# Patient Record
Sex: Male | Born: 1960 | Race: White | Hispanic: No | Marital: Single | Smoking: Never smoker
Health system: Southern US, Community
[De-identification: ages and names within clinical notes are randomized; demographics above are authoritative.]

## PROBLEM LIST (undated history)

## (undated) DIAGNOSIS — K219 Gastro-esophageal reflux disease without esophagitis: Secondary | ICD-10-CM

## (undated) DIAGNOSIS — K2100 Gastro-esophageal reflux disease with esophagitis, without bleeding: Secondary | ICD-10-CM

## (undated) DIAGNOSIS — L259 Unspecified contact dermatitis, unspecified cause: Secondary | ICD-10-CM

## (undated) DIAGNOSIS — L989 Disorder of the skin and subcutaneous tissue, unspecified: Secondary | ICD-10-CM

## (undated) DIAGNOSIS — G4700 Insomnia, unspecified: Secondary | ICD-10-CM

## (undated) DIAGNOSIS — S92309A Fracture of unspecified metatarsal bone(s), unspecified foot, initial encounter for closed fracture: Secondary | ICD-10-CM

## (undated) DIAGNOSIS — R5383 Other fatigue: Secondary | ICD-10-CM

## (undated) DIAGNOSIS — I5189 Other ill-defined heart diseases: Secondary | ICD-10-CM

## (undated) DIAGNOSIS — M79602 Pain in left arm: Secondary | ICD-10-CM

## (undated) DIAGNOSIS — K5790 Diverticulosis of intestine, part unspecified, without perforation or abscess without bleeding: Secondary | ICD-10-CM

## (undated) DIAGNOSIS — M47812 Spondylosis without myelopathy or radiculopathy, cervical region: Secondary | ICD-10-CM

## (undated) DIAGNOSIS — E559 Vitamin D deficiency, unspecified: Secondary | ICD-10-CM

## (undated) DIAGNOSIS — R1310 Dysphagia, unspecified: Secondary | ICD-10-CM

## (undated) DIAGNOSIS — F439 Reaction to severe stress, unspecified: Secondary | ICD-10-CM

## (undated) DIAGNOSIS — M5410 Radiculopathy, site unspecified: Secondary | ICD-10-CM

## (undated) DIAGNOSIS — E669 Obesity, unspecified: Secondary | ICD-10-CM

## (undated) DIAGNOSIS — R1012 Left upper quadrant pain: Secondary | ICD-10-CM

## (undated) DIAGNOSIS — I10 Essential (primary) hypertension: Secondary | ICD-10-CM

## (undated) DIAGNOSIS — R140 Abdominal distension (gaseous): Secondary | ICD-10-CM

## (undated) DIAGNOSIS — R079 Chest pain, unspecified: Secondary | ICD-10-CM

## (undated) DIAGNOSIS — G4710 Hypersomnia, unspecified: Secondary | ICD-10-CM

## (undated) DIAGNOSIS — R202 Paresthesia of skin: Secondary | ICD-10-CM

## (undated) DIAGNOSIS — E785 Hyperlipidemia, unspecified: Secondary | ICD-10-CM

## (undated) DIAGNOSIS — M5412 Radiculopathy, cervical region: Secondary | ICD-10-CM

## (undated) DIAGNOSIS — J309 Allergic rhinitis, unspecified: Secondary | ICD-10-CM

## (undated) DIAGNOSIS — L309 Dermatitis, unspecified: Secondary | ICD-10-CM

## (undated) HISTORY — DX: Essential (primary) hypertension: I10

## (undated) HISTORY — DX: Radiculopathy, site unspecified: M54.10

## (undated) HISTORY — DX: Dermatitis, unspecified: L30.9

## (undated) HISTORY — DX: Left upper quadrant pain: R10.12

## (undated) HISTORY — DX: Hypersomnia, unspecified: G47.10

## (undated) HISTORY — DX: Reaction to severe stress, unspecified: F43.9

## (undated) HISTORY — DX: Unspecified contact dermatitis, unspecified cause: L25.9

## (undated) HISTORY — DX: Obesity, unspecified: E66.9

## (undated) HISTORY — DX: Disorder of the skin and subcutaneous tissue, unspecified: L98.9

## (undated) HISTORY — DX: Gastro-esophageal reflux disease with esophagitis, without bleeding: K21.00

## (undated) HISTORY — DX: Hyperlipidemia, unspecified: E78.5

## (undated) HISTORY — DX: Insomnia, unspecified: G47.00

## (undated) HISTORY — DX: Allergic rhinitis, unspecified: J30.9

## (undated) HISTORY — DX: Radiculopathy, cervical region: M54.12

## (undated) HISTORY — DX: Vitamin D deficiency, unspecified: E55.9

## (undated) HISTORY — DX: Fracture of unspecified metatarsal bone(s), unspecified foot, initial encounter for closed fracture: S92.309A

## (undated) HISTORY — DX: Other fatigue: R53.83

## (undated) HISTORY — DX: Gastro-esophageal reflux disease with esophagitis: K21.0

## (undated) HISTORY — DX: Dysphagia, unspecified: R13.10

## (undated) HISTORY — DX: Spondylosis without myelopathy or radiculopathy, cervical region: M47.812

## (undated) HISTORY — DX: Diverticulosis of intestine, part unspecified, without perforation or abscess without bleeding: K57.90

## (undated) HISTORY — DX: Gastro-esophageal reflux disease without esophagitis: K21.9

## (undated) HISTORY — DX: Abdominal distension (gaseous): R14.0

## (undated) HISTORY — DX: Other ill-defined heart diseases: I51.89

## (undated) HISTORY — DX: Pain in left arm: M79.602

## (undated) HISTORY — DX: Paresthesia of skin: R20.2

---

## 1998-10-14 ENCOUNTER — Encounter: Payer: Self-pay | Admitting: Emergency Medicine

## 1998-10-14 ENCOUNTER — Emergency Department (HOSPITAL_COMMUNITY): Admission: EM | Admit: 1998-10-14 | Discharge: 1998-10-14 | Payer: Self-pay | Admitting: Emergency Medicine

## 2001-01-26 ENCOUNTER — Encounter: Payer: Self-pay | Admitting: Emergency Medicine

## 2001-01-26 ENCOUNTER — Emergency Department (HOSPITAL_COMMUNITY): Admission: EM | Admit: 2001-01-26 | Discharge: 2001-01-26 | Payer: Self-pay | Admitting: Emergency Medicine

## 2001-12-31 ENCOUNTER — Observation Stay (HOSPITAL_COMMUNITY): Admission: EM | Admit: 2001-12-31 | Discharge: 2002-01-01 | Payer: Self-pay

## 2002-02-03 ENCOUNTER — Encounter: Payer: Self-pay | Admitting: Family Medicine

## 2002-02-03 ENCOUNTER — Encounter: Admission: RE | Admit: 2002-02-03 | Discharge: 2002-02-03 | Payer: Self-pay | Admitting: Family Medicine

## 2002-10-14 ENCOUNTER — Encounter (INDEPENDENT_AMBULATORY_CARE_PROVIDER_SITE_OTHER): Payer: Self-pay

## 2002-10-14 ENCOUNTER — Ambulatory Visit (HOSPITAL_COMMUNITY): Admission: RE | Admit: 2002-10-14 | Discharge: 2002-10-14 | Payer: Self-pay | Admitting: Gastroenterology

## 2003-01-27 ENCOUNTER — Encounter: Payer: Self-pay | Admitting: Neurosurgery

## 2003-01-27 ENCOUNTER — Encounter: Admission: RE | Admit: 2003-01-27 | Discharge: 2003-01-27 | Payer: Self-pay | Admitting: Neurosurgery

## 2003-01-27 ENCOUNTER — Encounter: Payer: Self-pay | Admitting: Radiology

## 2003-02-09 ENCOUNTER — Encounter: Payer: Self-pay | Admitting: Neurosurgery

## 2003-02-09 ENCOUNTER — Encounter: Admission: RE | Admit: 2003-02-09 | Discharge: 2003-02-09 | Payer: Self-pay | Admitting: Neurosurgery

## 2003-02-20 ENCOUNTER — Encounter: Payer: Self-pay | Admitting: Neurosurgery

## 2003-02-20 ENCOUNTER — Encounter: Admission: RE | Admit: 2003-02-20 | Discharge: 2003-02-20 | Payer: Self-pay | Admitting: Neurosurgery

## 2003-04-01 ENCOUNTER — Encounter: Admission: RE | Admit: 2003-04-01 | Discharge: 2003-04-01 | Payer: Self-pay | Admitting: Family Medicine

## 2003-04-01 ENCOUNTER — Encounter: Payer: Self-pay | Admitting: Family Medicine

## 2003-09-10 ENCOUNTER — Encounter: Admission: RE | Admit: 2003-09-10 | Discharge: 2003-09-10 | Payer: Self-pay | Admitting: Neurosurgery

## 2003-09-29 ENCOUNTER — Encounter: Admission: RE | Admit: 2003-09-29 | Discharge: 2003-09-29 | Payer: Self-pay | Admitting: Neurosurgery

## 2003-10-13 ENCOUNTER — Encounter: Admission: RE | Admit: 2003-10-13 | Discharge: 2003-10-13 | Payer: Self-pay | Admitting: Neurosurgery

## 2004-01-19 ENCOUNTER — Encounter: Admission: RE | Admit: 2004-01-19 | Discharge: 2004-01-19 | Payer: Self-pay | Admitting: Otolaryngology

## 2004-02-26 HISTORY — PX: OTHER SURGICAL HISTORY: SHX169

## 2005-10-12 ENCOUNTER — Encounter: Admission: RE | Admit: 2005-10-12 | Discharge: 2005-10-12 | Payer: Self-pay | Admitting: Gastroenterology

## 2007-01-11 ENCOUNTER — Encounter: Admission: RE | Admit: 2007-01-11 | Discharge: 2007-01-11 | Payer: Self-pay | Admitting: Neurosurgery

## 2008-08-30 ENCOUNTER — Emergency Department (HOSPITAL_BASED_OUTPATIENT_CLINIC_OR_DEPARTMENT_OTHER): Admission: EM | Admit: 2008-08-30 | Discharge: 2008-08-31 | Payer: Self-pay | Admitting: Emergency Medicine

## 2008-08-30 ENCOUNTER — Ambulatory Visit: Payer: Self-pay | Admitting: Diagnostic Radiology

## 2009-10-14 ENCOUNTER — Encounter: Admission: RE | Admit: 2009-10-14 | Discharge: 2009-10-14 | Payer: Self-pay | Admitting: Gastroenterology

## 2009-11-02 ENCOUNTER — Encounter: Admission: RE | Admit: 2009-11-02 | Discharge: 2009-11-02 | Payer: Self-pay | Admitting: Gastroenterology

## 2009-11-26 HISTORY — PX: OTHER SURGICAL HISTORY: SHX169

## 2009-12-02 ENCOUNTER — Emergency Department (HOSPITAL_COMMUNITY): Admission: EM | Admit: 2009-12-02 | Discharge: 2009-12-02 | Payer: Self-pay | Admitting: Emergency Medicine

## 2009-12-08 ENCOUNTER — Ambulatory Visit (HOSPITAL_COMMUNITY): Admission: RE | Admit: 2009-12-08 | Discharge: 2009-12-08 | Payer: Self-pay | Admitting: General Surgery

## 2010-03-28 HISTORY — PX: OTHER SURGICAL HISTORY: SHX169

## 2010-09-18 ENCOUNTER — Encounter: Payer: Self-pay | Admitting: Neurosurgery

## 2010-09-28 DIAGNOSIS — R202 Paresthesia of skin: Secondary | ICD-10-CM

## 2010-09-28 HISTORY — DX: Paresthesia of skin: R20.2

## 2010-11-16 LAB — CBC
HCT: 40.6 % (ref 39.0–52.0)
Hemoglobin: 13.7 g/dL (ref 13.0–17.0)
MCHC: 33.9 g/dL (ref 30.0–36.0)
MCV: 87.9 fL (ref 78.0–100.0)
MCV: 88.4 fL (ref 78.0–100.0)
Platelets: 207 10*3/uL (ref 150–400)
RBC: 4.61 MIL/uL (ref 4.22–5.81)
WBC: 7.5 10*3/uL (ref 4.0–10.5)

## 2010-11-16 LAB — DIFFERENTIAL
Basophils Absolute: 0 10*3/uL (ref 0.0–0.1)
Eosinophils Relative: 1 % (ref 0–5)
Lymphocytes Relative: 45 % (ref 12–46)
Lymphs Abs: 3.4 10*3/uL (ref 0.7–4.0)
Monocytes Absolute: 0.6 10*3/uL (ref 0.1–1.0)

## 2010-11-16 LAB — COMPREHENSIVE METABOLIC PANEL
AST: 31 U/L (ref 0–37)
Albumin: 3.7 g/dL (ref 3.5–5.2)
Chloride: 106 mEq/L (ref 96–112)
Creatinine, Ser: 0.79 mg/dL (ref 0.4–1.5)
GFR calc Af Amer: >60 mL/min (ref >60)
Potassium: 3.9 mEq/L (ref 3.5–5.1)
Total Bilirubin: 0.6 mg/dL (ref 0.3–1.2)

## 2010-11-16 LAB — URINALYSIS, ROUTINE W REFLEX MICROSCOPIC
Bilirubin Urine: NEGATIVE
Glucose, UA: NEGATIVE mg/dL
Hgb urine dipstick: NEGATIVE
Specific Gravity, Urine: 1.017 (ref 1.005–1.030)
pH: 7.5 (ref 5.0–8.0)

## 2010-12-27 ENCOUNTER — Other Ambulatory Visit: Payer: Self-pay | Admitting: Internal Medicine

## 2010-12-27 DIAGNOSIS — R109 Unspecified abdominal pain: Secondary | ICD-10-CM

## 2011-01-02 ENCOUNTER — Ambulatory Visit
Admission: RE | Admit: 2011-01-02 | Discharge: 2011-01-02 | Disposition: A | Discharge status: Home / Self Care | Payer: 59 | Source: Ambulatory Visit | Attending: Internal Medicine | Admitting: Internal Medicine

## 2011-01-02 DIAGNOSIS — R109 Unspecified abdominal pain: Secondary | ICD-10-CM

## 2011-01-13 NOTE — Op Note (Signed)
   NAME:  Joseph Clark, Joseph Clark                        ACCOUNT NO.:  [id number]   MEDICAL RECORD NO.:  [id number]                   PATIENT TYPE:  AMB   LOCATION:  ENDO                                 FACILITY:  [facility]   PHYSICIAN:  Petra Kuba, M.D.                 DATE OF BIRTH:  1961/08/13   DATE OF PROCEDURE:  10/14/2002  DATE OF DISCHARGE:                                 OPERATIVE REPORT   PROCEDURE PERFORMED:  Esophagogastroduodenoscopy with biopsy.   ENDOSCOPIST:  Petra Kuba, M.D.   INDICATIONS FOR PROCEDURE:  Longstanding upper tract symptoms.  Want to rule  out Barrett's or other abnormalities.  Consent was signed after the risks,  benefits, methods and options were thoroughly discussed in the office.   MEDICINES USED:  Demerol 50 mg, Versed 5 mg.   DESCRIPTION OF PROCEDURE:  The video endoscope was inserted by direct  vision.  The esophagus was normal.  The distal esophagus was a tiny hiatal  hernia. Scope passed into the stomach where some atrophic gastritis and  antritis were seen and advanced through a normal pylorus into a normal  duodenal bulb and around the C-loop to a normal second portion of the  duodenum.  The scope was withdrawn back to the bulb and a good look there  ruled out ulcers in that location.  Scope was withdrawn back to the stomach  and retroflexed.  The angularis, cardia and fundus were normal except for  the tiny hiatal hernia being confirmed in the cardia.  The lesser and  greater curve were normal on retroflex visualization.  Straight  visualization of the stomach was normal without additional findings except  for the minimal gastritis.  Went ahead and took a few biopsies of the  antrum, few of the proximal stomach to rule out helicobacter, confirm the  gastritis.  Air was suctioned, scope removed.  Again, a good look at the  esophagus was normal without signs of Barrett's or obvious esophagitis.  The  s   ENDOSCOPIC DIAGNOSIS:  1. Tiny hiatal  hernia.  2. Minimal gastritis, antritis, status post biopsy.  3. Otherwise normal esophagogastroduodenoscopy.   PLAN:  Await pathology rule out to Helicobacter pylori.  Continue pump  inhibitors.  Happy to see back sooner p.r.n. or in six months.                                               Petra Kuba, M.D.   MEM/MEDQ  D:  10/14/2002  T:  10/14/2002  Job:  [id number]   cc:   Thelma Barge P. Modesto Charon, M.D.  [address]  [place]  [place] [address]  Fax: [phone]

## 2011-01-13 NOTE — Op Note (Signed)
[facility]  Patient:    Joseph Clark, Joseph Clark Visit Number: [id number] MRN: [id number]          Service Type: MED Location: 2000 2023 01 Attending Physician:  Jetty Duhamel T Dictated by:   Pearletha Furl Alanda Amass, M.D. Proc. Date: 12/31/01 Admit Date:  12/30/2001 Discharge Date: 01/01/2002   CC:         Cardiac Catheterization Lab  Redmond Baseman, M.D.  Lenise Herald, M.D.  Petra Kuba, M.D.   Operative Report  PROCEDURES: 1. Retrograde central aortic catheterization. 2. Selective coronary angiography by Judkins technique. 3. Left ventricular angiogram RAO/LAO projection. 4. Hand injection, abdominal aorta.  DESCRIPTION OF PROCEDURE:  The patient was brought to the second floor CP lab in the postabsorptive state after 5 mg of Valium p.o. premedication.  The right groin was prepped, draped in the usual manner.  Xylocaine, 1%, was used for local anesthesia.  The CRFA was entered with a single anterior puncture using a #18 thin-walled needle and a #6 [place] short Daig sidearm sheath were inserted without difficulty.  Catheterization was performed with #6 French 4-cm tapered preformed coronary and pigtail catheters using Omnipaque dye throughout the procedure.  Pullback pressures to the CA were performed after LV angiogram was done in the RAO and LAO projection, 25 cc/14 cc per second, 20 cc/12 cc per second.  There was no gradient across the aortic valve.  Hand injection above the level of the renal arteries of 5-7 cc showed normal single renal arteries bilaterally, widely patent, and normal-appearing infrarenal abdominal aorta.  The catheter was removed.  Sidearm sheath was flushed.  ACT was measured. Heparin was on hold, coming to the laboratory.  The patient was transferred to the holding area for sheath removal and pressure hemostasis in stable condition.  He tolerated the procedure well.  PRESSURES: 1. LV:  125/0. 2.  LVEDP:  16 mmHg. 3. CA:  125/70 mmHg. 4. There was no gradient across the aortic valve on catheter pullback.  CORONARY ARTERIOGRAM:  Fluoroscopy did not reveal any coronary, intracardiac, or valvular calcification. 1. The main left coronary was normal.  2. The left anterior descending artery was widely patent and smooth throughout    its course.  It coursed to the apex and undersurface of the heart and was    normal.  3. The first diagonal branch trifurcated, was of moderate size.  It arose    after SP-1 and was normal.  4. The small to moderate sized optional diagonal branch (trifurcation vessel)    was normal.  5. The circumflex was nondominant, of moderate size, gave off a small OM-1, a    large trifurcating OM-2, and bifurcated distally.  The circumflex and its    branches were normal and there was a normal PABG branch.  6. The right coronary was a dominant vessel.  The PDA arose near the acute    margin.  The vessel was widely patent and smooth throughout its course and    normal.  LEFT VENTRICULAR ANGIOGRAM:  LV angiogram in the RAO and LAO projection showed a normally contracting ventricle with no segmental wall motion abnormality and no mitral regurgitation.  EF approximately 60%.  BRIEF HISTORY:  This 50 year old divorced [address] is a nonsmoker but has a strong family history of coronary disease.  His mother is a patient of ours and had CAD and interventions in her 25s.  The patient recently was found to have relatively low  HDLs and elevated glucose and was to be evaluated for this.  Over the last week, he has had episodic left precordial chest pain radiating to the left shoulder and left upper extremity. This has occurred both at rest and with exertion.  He has had a prior history of GERD but has been on Nexium as an outpatient.  He has no typical GERD symptoms.  His one CPK was elevated with negative MB and troponin and EKG showed no acute  change after admission by Dr. Anastasio Auerbach for evaluation.  It was felt best that we should proceed with diagnostic catheterization in this setting based on the patients history and his other risk factors such as strong family history of CAD and IDDM and his abnormal laboratory as outlined above.  Fortunately, the patient has normal coronary arteries and left ventricle with no evidence of coronary artery disease.  We recommend reassurance to the patient.  If he has early metabolic syndrome or early diabetes, this should be evaluated.  He is a nonsmoker.  Weight reduction, exercise program may be in order and empiric GI therapy.  Etiology of this chest pain and exertional fatigue is not clear.  CATHETERIZATION DIAGNOSES: 1. Chest pain etiology not determined, probably musculoskeletal.  Rule out    gastrointestinal etiology. 2. Normal coronary arteries and left ventricle. 3. History of mild elevated glucose. 4. Gastroesophageal reflux disease, under therapy. 5. Mild exogenous obesity. 6. Remote left anterior cruciate ligament repair, 1990. 7. Nonsmoker. Dictated by:   Pearletha Furl Alanda Amass, M.D. Attending Physician:  Jetty Duhamel T DD:  12/31/01 TD:  01/01/02 Job: [id number]

## 2011-11-17 ENCOUNTER — Other Ambulatory Visit (HOSPITAL_COMMUNITY): Payer: Self-pay | Admitting: Neurosurgery

## 2011-11-17 DIAGNOSIS — M542 Cervicalgia: Secondary | ICD-10-CM

## 2011-11-20 ENCOUNTER — Inpatient Hospital Stay (HOSPITAL_COMMUNITY): Admission: RE | Admit: 2011-11-20 | Payer: 59 | Source: Ambulatory Visit

## 2011-12-02 ENCOUNTER — Ambulatory Visit (HOSPITAL_COMMUNITY)
Admission: RE | Admit: 2011-12-02 | Discharge: 2011-12-02 | Disposition: A | Discharge status: Home / Self Care | Payer: 59 | Source: Ambulatory Visit | Attending: Neurosurgery | Admitting: Neurosurgery

## 2011-12-02 DIAGNOSIS — M542 Cervicalgia: Secondary | ICD-10-CM | POA: Insufficient documentation

## 2011-12-02 DIAGNOSIS — M47812 Spondylosis without myelopathy or radiculopathy, cervical region: Secondary | ICD-10-CM | POA: Insufficient documentation

## 2011-12-02 DIAGNOSIS — R209 Unspecified disturbances of skin sensation: Secondary | ICD-10-CM | POA: Insufficient documentation

## 2011-12-02 DIAGNOSIS — M5380 Other specified dorsopathies, site unspecified: Secondary | ICD-10-CM | POA: Insufficient documentation

## 2012-10-04 ENCOUNTER — Other Ambulatory Visit: Payer: Self-pay | Admitting: Gastroenterology

## 2013-03-14 ENCOUNTER — Other Ambulatory Visit: Payer: Self-pay | Admitting: Nurse Practitioner

## 2013-03-14 ENCOUNTER — Ambulatory Visit
Admission: RE | Admit: 2013-03-14 | Discharge: 2013-03-14 | Disposition: A | Discharge status: Home / Self Care | Payer: 59 | Source: Ambulatory Visit | Attending: Nurse Practitioner | Admitting: Nurse Practitioner

## 2013-03-14 DIAGNOSIS — R109 Unspecified abdominal pain: Secondary | ICD-10-CM

## 2013-04-24 ENCOUNTER — Other Ambulatory Visit: Payer: Self-pay | Admitting: Internal Medicine

## 2013-07-19 ENCOUNTER — Encounter: Payer: Self-pay | Admitting: Cardiology

## 2013-07-19 ENCOUNTER — Encounter: Payer: Self-pay | Admitting: *Deleted

## 2013-07-19 DIAGNOSIS — J309 Allergic rhinitis, unspecified: Secondary | ICD-10-CM | POA: Insufficient documentation

## 2013-07-19 DIAGNOSIS — K5790 Diverticulosis of intestine, part unspecified, without perforation or abscess without bleeding: Secondary | ICD-10-CM | POA: Insufficient documentation

## 2013-07-19 DIAGNOSIS — L989 Disorder of the skin and subcutaneous tissue, unspecified: Secondary | ICD-10-CM | POA: Insufficient documentation

## 2013-07-19 DIAGNOSIS — E669 Obesity, unspecified: Secondary | ICD-10-CM | POA: Insufficient documentation

## 2013-07-19 DIAGNOSIS — L309 Dermatitis, unspecified: Secondary | ICD-10-CM | POA: Insufficient documentation

## 2013-07-19 DIAGNOSIS — E559 Vitamin D deficiency, unspecified: Secondary | ICD-10-CM | POA: Insufficient documentation

## 2013-07-19 DIAGNOSIS — R5383 Other fatigue: Secondary | ICD-10-CM | POA: Insufficient documentation

## 2013-07-19 DIAGNOSIS — E785 Hyperlipidemia, unspecified: Secondary | ICD-10-CM | POA: Insufficient documentation

## 2013-07-19 DIAGNOSIS — I10 Essential (primary) hypertension: Secondary | ICD-10-CM | POA: Insufficient documentation

## 2013-07-19 DIAGNOSIS — K219 Gastro-esophageal reflux disease without esophagitis: Secondary | ICD-10-CM | POA: Insufficient documentation

## 2013-07-23 ENCOUNTER — Other Ambulatory Visit: Payer: Self-pay | Admitting: *Deleted

## 2013-07-23 ENCOUNTER — Institutional Professional Consult (permissible substitution): Payer: 59 | Admitting: Cardiology

## 2013-08-08 ENCOUNTER — Institutional Professional Consult (permissible substitution): Payer: 59 | Admitting: Cardiology

## 2013-08-18 ENCOUNTER — Encounter: Payer: Self-pay | Admitting: Cardiology

## 2013-08-20 ENCOUNTER — Encounter: Payer: Self-pay | Admitting: Cardiology

## 2013-08-20 ENCOUNTER — Ambulatory Visit (INDEPENDENT_AMBULATORY_CARE_PROVIDER_SITE_OTHER): Payer: 59 | Admitting: Cardiology

## 2013-08-20 ENCOUNTER — Encounter: Payer: Self-pay | Admitting: *Deleted

## 2013-08-20 VITALS — BP 132/90 | HR 65 | Ht 66.0 in | Wt 219.0 lb

## 2013-08-20 DIAGNOSIS — R9431 Abnormal electrocardiogram [ECG] [EKG]: Secondary | ICD-10-CM

## 2013-08-20 DIAGNOSIS — I208 Other forms of angina pectoris: Secondary | ICD-10-CM

## 2013-08-20 DIAGNOSIS — E669 Obesity, unspecified: Secondary | ICD-10-CM

## 2013-08-20 MED ORDER — METOPROLOL SUCCINATE ER 25 MG PO TB24: 25.0000 mg | ORAL_TABLET | Freq: Every day | ORAL | Status: DC

## 2013-08-20 MED ORDER — NITROGLYCERIN 0.4 MG SL SUBL: 0.4000 mg | SUBLINGUAL_TABLET | SUBLINGUAL | Status: AC | PRN
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
## 2013-08-20 NOTE — Patient Instructions (Addendum)
Your physician has requested that you have a LEFT cardiac catheterization WITH RADIAL APPROACH (September 11, 2013 ARRIVE AT 7:00 AM AT [facility]). Cardiac catheterization is used to diagnose and/or treat various heart conditions. Doctors may recommend this procedure for a number of different reasons. The most common reason is to evaluate chest pain. Chest pain can be a symptom of coronary artery disease (CAD), and cardiac catheterization can show whether plaque is narrowing or blocking your heart's arteries. This procedure is also used to evaluate the valves, as well as measure the blood flow and oxygen levels in different parts of your heart. Please follow instruction sheet, as given.  Your physician recommends that you schedule a follow-up appointment IN: 1 WEEK AFTER SCHEDULED CARDIAC CATH   Your physician recommends that you return for lab work in: (CBC, BMET, PT-INR) (September 02, 2013)  Your physician has recommended you make the following change in your medication:   START TOPROL 25 MG ONCE A DAY START NITRO 0.4 MG ONLY AS NEEDED FOR CHEST PAINS  Your physician recommends that you continue on your current medications as directed. Please refer to the Current Medication list given to you today.

## 2013-09-02 ENCOUNTER — Other Ambulatory Visit: Payer: 59

## 2013-09-04 ENCOUNTER — Telehealth: Payer: Self-pay | Admitting: *Deleted

## 2013-09-04 ENCOUNTER — Encounter: Payer: Self-pay | Admitting: *Deleted

## 2013-09-04 NOTE — Telephone Encounter (Signed)
called pt and lm to change the time of his left heart cath and instructions. Pt called back and stated he would like to cancel the procedure and he will call back the week of the 19th to reschedule due to activities in his life. Pt also stated he is not having any heart related problems right now. Called Dr. Anne FuSkains to inform him of the chagnes and he is fine with this and advised that if pt is having any medical problems to seek medical help. Called pt and lm with Dr. Anne FuSkains advise and number was provided if he had any questions. Pt is also aware that he has to do labs before his heart cath and he agrees. [facility].

## 2013-09-04 NOTE — Telephone Encounter (Signed)
Agree 

## 2013-09-04 NOTE — Telephone Encounter (Signed)
lmtcb so I can go over his new instructions for his Lft Heart Cath. Number Provided

## 2013-09-11 ENCOUNTER — Encounter (HOSPITAL_COMMUNITY): Payer: Self-pay

## 2013-09-11 ENCOUNTER — Ambulatory Visit (HOSPITAL_COMMUNITY): Admit: 2013-09-11 | Payer: Self-pay | Admitting: Cardiology

## 2013-09-11 SURGERY — LEFT HEART CATHETERIZATION WITH CORONARY ANGIOGRAM
Anesthesia: LOCAL

## 2013-09-19 ENCOUNTER — Ambulatory Visit: Payer: 59 | Admitting: Cardiology

## 2013-10-06 ENCOUNTER — Encounter (INDEPENDENT_AMBULATORY_CARE_PROVIDER_SITE_OTHER): Payer: Self-pay

## 2013-10-06 ENCOUNTER — Encounter: Payer: Self-pay | Admitting: Cardiology

## 2013-10-06 ENCOUNTER — Ambulatory Visit (INDEPENDENT_AMBULATORY_CARE_PROVIDER_SITE_OTHER): Payer: 59 | Admitting: Cardiology

## 2013-10-06 VITALS — BP 128/86 | HR 73 | Ht 66.0 in | Wt 217.0 lb

## 2013-10-06 DIAGNOSIS — E669 Obesity, unspecified: Secondary | ICD-10-CM

## 2013-10-06 DIAGNOSIS — R079 Chest pain, unspecified: Secondary | ICD-10-CM

## 2013-10-06 DIAGNOSIS — R9431 Abnormal electrocardiogram [ECG] [EKG]: Secondary | ICD-10-CM

## 2013-10-06 NOTE — Patient Instructions (Signed)
Your physician recommends that you continue on your current medications as directed. Please refer to the Current Medication list given to you today.  Your physician has requested that you have en exercise stress myoview. For further information please visit [email or web address]. Please follow instruction sheet, as given.  Your physician wants you to follow-up in: 6 month follow-up with Dr. Anne FuSkains. You will receive a reminder letter in the mail two months in advance. If you don't receive a letter, please call our office to schedule the follow-up appointment.
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
## 2013-10-23 ENCOUNTER — Encounter (HOSPITAL_COMMUNITY): Payer: 59

## 2013-10-27 ENCOUNTER — Encounter (HOSPITAL_COMMUNITY): Payer: 59

## 2013-11-05 ENCOUNTER — Encounter (HOSPITAL_COMMUNITY): Payer: 59

## 2013-11-06 ENCOUNTER — Encounter (HOSPITAL_COMMUNITY): Payer: 59 | Attending: Cardiology | Admitting: Radiology

## 2013-11-06 VITALS — BP 131/84 | HR 63 | Ht 66.0 in | Wt 210.0 lb

## 2013-11-06 DIAGNOSIS — R079 Chest pain, unspecified: Secondary | ICD-10-CM | POA: Insufficient documentation

## 2013-11-06 DIAGNOSIS — R9431 Abnormal electrocardiogram [ECG] [EKG]: Secondary | ICD-10-CM | POA: Insufficient documentation

## 2013-11-06 MED ORDER — TECHNETIUM TC 99M SESTAMIBI GENERIC - CARDIOLITE
33.0000 | Freq: Once | INTRAVENOUS | Status: AC | PRN
  Administered 2013-11-06: 33 via INTRAVENOUS

## 2013-11-06 MED ORDER — TECHNETIUM TC 99M SESTAMIBI GENERIC - CARDIOLITE
11.0000 | Freq: Once | INTRAVENOUS | Status: AC | PRN
  Administered 2013-11-06: 11 via INTRAVENOUS

## 2013-11-06 NOTE — Progress Notes (Signed)
[facility] SITE 3 NUCLEAR MED [address] [place], [place] [address] [phone]    Cardiology Nuclear Med Study  Joseph SnipesVictor S Clark is a 53 y.o. male     MRN : [id number]     DOB: Feb 24, 1961  Procedure Date: 11/06/2013  Nuclear Med Background Indication for Stress Test:  Evaluation for Ischemia History:  No known CAD, Cath 2002 (normal), Echo 2013 EF 66%, MPI (normal) 2013 EF 75% Cardiac Risk Factors: Family History - CAD, Hypertension and Lipids  Symptoms:  Chest Pain and SOB   Nuclear Pre-Procedure Caffeine/Decaff Intake:  None NPO After: 3:00am   Lungs:  clear O2 Sat: 98% on room air. IV 0.9% NS with Angio Cath:  22g  IV Site: R Hand  IV Started by:  Joseph LevanJackie Smith, RN  Chest Size (in):  46 Cup Size: n/a  Height: 5\' 6"  (1.676 m)  Weight:  210 lb (95.255 kg)  BMI:  Body mass index is 33.91 kg/(m^2). Tech Comments:  Toprol held x 24 hours    Nuclear Med Study 1 or 2 day study: 1 day  Stress Test Type:  Stress  Reading MD: N/A  Order Authorizing Provider:  Donato SchultzMark Skains, MD  Resting Radionuclide: Technetium 5357m Sestamibi  Resting Radionuclide Dose: 11.0 mCi   Stress Radionuclide:  Technetium 2457m Sestamibi  Stress Radionuclide Dose: 33.0 mCi           Stress Protocol Rest HR: 63 Stress HR: 144  Rest BP: 131/84 Stress BP: 175/88  Exercise Time (min): 7:30 METS: 9.3           Dose of Adenosine (mg):  n/a Dose of Lexiscan: n/a mg  Dose of Atropine (mg): n/a Dose of Dobutamine: n/a mcg/kg/min (at max HR)  Stress Test Technologist: Joseph Clark, BS-ES  Nuclear Technologist:  Joseph Clark, CNMT     Rest Procedure:  Myocardial perfusion imaging was performed at rest 45 minutes following the intravenous administration of Technetium 1357m Sestamibi. Rest ECG: NSR with non-specific ST-T wave changes and T wave inversions in inferolateral leads.  Stress Procedure:  The patient exercised on the treadmill utilizing the Bruce Protocol for 7:30 minutes. The patient  stopped due to 3/10 chest tighness.  Technetium 3757m Sestamibi was injected at peak exercise and myocardial perfusion imaging was performed after a brief delay.  Stress ECG: No significant change from baseline ECG  QPS Raw Data Images:  Normal; no motion artifact; normal heart/lung ratio. Stress Images:  Normal homogeneous uptake in all areas of the myocardium. Rest Images:  Normal homogeneous uptake in all areas of the myocardium. Subtraction (SDS):  No evidence of ischemia. Transient Ischemic Dilatation (Normal <1.22):  0.89 Lung/Heart Ratio (Normal <0.45):  0.38  Quantitative Gated Spect Images QGS EDV:  99 ml QGS ESV:  32 ml  Impression Exercise Capacity:  Fair exercise capacity. BP Response:  Normal blood pressure response. Clinical Symptoms:  Mild chest pain/dyspnea. ECG Impression:  No significant ST segment change suggestive of ischemia. Comparison with Prior Nuclear Study: No images to compare  Overall Impression:  Normal stress nuclear study.  LV Ejection Fraction: 68%.  LV Wall Motion:  NL LV Function; NL Wall Motion  Limited Joseph Clark

## 2013-11-12 ENCOUNTER — Telehealth: Payer: Self-pay | Admitting: Cardiology

## 2013-11-12 NOTE — Telephone Encounter (Signed)
New message  ° ° °Returning call back to nurse.  °

## 2013-11-19 NOTE — Telephone Encounter (Signed)
Left message on patients voicemail advising of NUC results, see result note.Marland Kitchen.Marland Kitchen..Marland Kitchen

## 2014-09-16 ENCOUNTER — Ambulatory Visit: Payer: Self-pay | Admitting: Dietician

## 2014-09-17 ENCOUNTER — Encounter: Payer: Self-pay | Admitting: Dietician

## 2014-09-17 ENCOUNTER — Encounter: Payer: 59 | Attending: Internal Medicine | Admitting: Dietician

## 2014-09-17 VITALS — Ht 66.5 in | Wt 225.6 lb

## 2014-09-17 DIAGNOSIS — E669 Obesity, unspecified: Secondary | ICD-10-CM

## 2014-09-17 DIAGNOSIS — Z6835 Body mass index (BMI) 35.0-35.9, adult: Secondary | ICD-10-CM | POA: Diagnosis not present

## 2014-09-17 DIAGNOSIS — Z713 Dietary counseling and surveillance: Secondary | ICD-10-CM | POA: Insufficient documentation

## 2014-09-17 NOTE — Progress Notes (Signed)
  Medical Nutrition Therapy:  Appt start time: 1520 end time:  1620.   Assessment:  Primary concerns today: Joseph Clark is here today since after retiring at the end of 2014 and being in very good shape he gained about 30 lbs. Stopped going to the gym and eating out a lot.   He lives by himself (divorced). Before Christmas started cutting out fried foods, sweet, and eating more salads/vegetables. Started working out with his neighbor who is a Systems analystpersonal trainer. Lost about 5 lbs since he made these changes. Weight now is the heaviest he has been.  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    Wants to make sure that he is eating the right things. Has a family hx of diabetes and heart disease. Eats about 1 full meal per day and snacks. Trying to count calories. Eating out about 10-11 meals per week.   Preferred Learning Style:   No preference indicated   Learning Readiness:   Ready  MEDICATIONS: see list   DIETARY INTAKE:  Usual eating pattern includes 2 meals and 2snacks per day.  Avoided foods include: fried foods (recently)  24-hr recall:  B ( before 8AM): glass of almond milk, V8 Fusion juice, black coffee, sometimes with applesauce Snk ( AM): none L ( PM): Asian Chicken Salad at [employer] or whatever he is close to when he's out, fish filet or chicken nuggets  Snk ( PM):none D ( PM): grilled salmon, salad with vinegar and sweet potato fries Snk ( PM): cucumbers, carrots, applesauce Beverages: water, almond milk, V8 Fusion, coffee, beer sometimes (2 max if out)  Usual physical activity: 3-4 x day 50 minutes circuit training with friend who is a Psychologist, educationaltrainer  Estimated energy needs: 2000 calories 225 g carbohydrates 150 g protein 56 g fat  Progress Towards Goal(s):  In progress.   Nutritional Diagnosis:  Adwolf-3.3 Overweight/obesity As related to large portions sizes and frequent restaurant meals.  As evidenced by BMI of 35.9.     Intervention:  Nutrition counseling provided.  Plan: Continue exercising around 4 x week. Add a protein to your carbohydrates at breakfast - protein shake with fruit, egg sandwich, or yogurt with fruit.  Have snacks available if you are hungry (protein and carbs) - peanut butter crackers, [facility] Protein bar, nuts with fruit, jerky, cheese sticks, yogurt. Portion out snacks ahead of time. Aim to fill half of your plate with vegetables at lunch dinner. Limit carbs and protein to a quarter of your plate (size of the palm of you hand). Choose lean protein (continue avoiding fried foods). Think about have frozen vegetables or pre washed and chopped vegetables to add to meals at home (rotisserie chicken). Try using small plates or boxing up half of your meal at restaurants to help with portions. Continue drinking mostly water.  Choose 100% whole wheat bread.  Teaching Method Utilized:  Visual Auditory Hands on  Handouts given during visit include:  MyPlate Handout  15 g CHO Snacks  Yellow Card  Barriers to learning/adherence to lifestyle change: none  Demonstrated degree of understanding via:  Teach Back   Monitoring/Evaluation:  Dietary intake, exercise, and body weight in 6 week(s).

## 2014-09-17 NOTE — Patient Instructions (Signed)
Continue exercising around 4 x week. Add a protein to your carbohydrates at breakfast - protein shake with fruit, egg sandwich, or yogurt with fruit.  Have snacks available if you are hungry (protein and carbs) - peanut butter crackers, [facility] Protein bar, nuts with fruit, jerky, cheese sticks, yogurt. Portion out snacks ahead of time. Aim to fill half of your plate with vegetables at lunch dinner. Limit carbs and protein to a quarter of your plate (size of the palm of you hand). Choose lean protein (continue avoiding fried foods). Think about have frozen vegetables or pre washed and chopped vegetables to add to meals at home (rotisserie chicken). Try using small plates or boxing up half of your meal at restaurants to help with portions. Continue drinking mostly water.  Choose 100% whole wheat bread.

## 2014-09-25 ENCOUNTER — Other Ambulatory Visit: Payer: Self-pay | Admitting: Gastroenterology

## 2014-09-25 DIAGNOSIS — R1084 Generalized abdominal pain: Secondary | ICD-10-CM

## 2014-10-01 ENCOUNTER — Ambulatory Visit
Admission: RE | Admit: 2014-10-01 | Discharge: 2014-10-01 | Disposition: A | Discharge status: Home / Self Care | Payer: 59 | Source: Ambulatory Visit | Attending: Gastroenterology | Admitting: Gastroenterology

## 2014-10-01 DIAGNOSIS — R1084 Generalized abdominal pain: Secondary | ICD-10-CM

## 2014-10-01 MED ORDER — IOHEXOL 300 MG/ML  SOLN
125.0000 mL | Freq: Once | INTRAMUSCULAR | Status: AC | PRN
  Administered 2014-10-01: 125 mL via INTRAVENOUS

## 2014-10-29 ENCOUNTER — Ambulatory Visit: Payer: 59 | Admitting: Dietician

## 2014-11-25 ENCOUNTER — Ambulatory Visit: Payer: 59 | Admitting: Dietician

## 2015-03-11 ENCOUNTER — Other Ambulatory Visit: Payer: Self-pay

## 2015-03-11 ENCOUNTER — Telehealth: Payer: Self-pay

## 2015-03-11 ENCOUNTER — Emergency Department (HOSPITAL_COMMUNITY): Payer: 59

## 2015-03-11 ENCOUNTER — Telehealth: Payer: Self-pay | Admitting: Cardiology

## 2015-03-11 ENCOUNTER — Encounter (HOSPITAL_COMMUNITY): Payer: Self-pay | Admitting: Neurology

## 2015-03-11 ENCOUNTER — Other Ambulatory Visit (HOSPITAL_COMMUNITY): Payer: Self-pay

## 2015-03-11 ENCOUNTER — Observation Stay (HOSPITAL_COMMUNITY)
Admission: EM | Admit: 2015-03-11 | Discharge: 2015-03-12 | Disposition: A | Discharge status: Home / Self Care | Payer: 59 | Attending: Cardiology | Admitting: Cardiology

## 2015-03-11 DIAGNOSIS — M4722 Other spondylosis with radiculopathy, cervical region: Secondary | ICD-10-CM | POA: Insufficient documentation

## 2015-03-11 DIAGNOSIS — R0789 Other chest pain: Principal | ICD-10-CM | POA: Insufficient documentation

## 2015-03-11 DIAGNOSIS — R079 Chest pain, unspecified: Secondary | ICD-10-CM | POA: Diagnosis present

## 2015-03-11 DIAGNOSIS — E669 Obesity, unspecified: Secondary | ICD-10-CM | POA: Diagnosis not present

## 2015-03-11 DIAGNOSIS — J309 Allergic rhinitis, unspecified: Secondary | ICD-10-CM | POA: Diagnosis not present

## 2015-03-11 DIAGNOSIS — Z8249 Family history of ischemic heart disease and other diseases of the circulatory system: Secondary | ICD-10-CM | POA: Insufficient documentation

## 2015-03-11 DIAGNOSIS — Z7982 Long term (current) use of aspirin: Secondary | ICD-10-CM | POA: Diagnosis not present

## 2015-03-11 DIAGNOSIS — Z6834 Body mass index (BMI) 34.0-34.9, adult: Secondary | ICD-10-CM | POA: Insufficient documentation

## 2015-03-11 DIAGNOSIS — Z79899 Other long term (current) drug therapy: Secondary | ICD-10-CM | POA: Insufficient documentation

## 2015-03-11 DIAGNOSIS — K219 Gastro-esophageal reflux disease without esophagitis: Secondary | ICD-10-CM | POA: Diagnosis present

## 2015-03-11 DIAGNOSIS — I10 Essential (primary) hypertension: Secondary | ICD-10-CM | POA: Insufficient documentation

## 2015-03-11 DIAGNOSIS — E785 Hyperlipidemia, unspecified: Secondary | ICD-10-CM | POA: Insufficient documentation

## 2015-03-11 DIAGNOSIS — K210 Gastro-esophageal reflux disease with esophagitis: Secondary | ICD-10-CM | POA: Insufficient documentation

## 2015-03-11 DIAGNOSIS — E559 Vitamin D deficiency, unspecified: Secondary | ICD-10-CM | POA: Diagnosis not present

## 2015-03-11 HISTORY — DX: Chest pain, unspecified: R07.9

## 2015-03-11 LAB — BASIC METABOLIC PANEL
Anion gap: 8 (ref 5–15)
BUN: 13 mg/dL (ref 6–20)
CHLORIDE: 106 mmol/L (ref 101–111)
CO2: 26 mmol/L (ref 22–32)
Calcium: 9.9 mg/dL (ref 8.9–10.3)
Creatinine, Ser: 0.94 mg/dL (ref 0.61–1.24)
Glucose, Bld: 121 mg/dL — ABNORMAL HIGH (ref 65–99)
Potassium: 4.4 mmol/L (ref 3.5–5.1)
Sodium: 140 mmol/L (ref 135–145)

## 2015-03-11 LAB — I-STAT TROPONIN, ED: TROPONIN I, POC: 0 ng/mL (ref 0.00–0.08)

## 2015-03-11 LAB — CBC
HCT: 42.9 % (ref 39.0–52.0)
Hemoglobin: 14.8 g/dL (ref 13.0–17.0)
MCH: 29.1 pg (ref 26.0–34.0)
MCHC: 34.5 g/dL (ref 30.0–36.0)
MCV: 84.4 fL (ref 78.0–100.0)
Platelets: 248 10*3/uL (ref 150–400)
RBC: 5.08 MIL/uL (ref 4.22–5.81)
RDW: 12.7 % (ref 11.5–15.5)
WBC: 8.1 10*3/uL (ref 4.0–10.5)

## 2015-03-11 MED ORDER — NITROGLYCERIN 0.4 MG SL SUBL: 0.4000 mg | SUBLINGUAL_TABLET | SUBLINGUAL | Status: DC | PRN

## 2015-03-11 MED ORDER — SODIUM CHLORIDE 0.9 % IV SOLN: 250.0000 mL | INTRAVENOUS | Status: DC | PRN

## 2015-03-11 MED ORDER — ASPIRIN EC 81 MG PO TBEC
81.0000 mg | DELAYED_RELEASE_TABLET | Freq: Every day | ORAL | Status: DC
  Administered 2015-03-12: 81 mg via ORAL
  Filled 2015-03-11: qty 1

## 2015-03-11 MED ORDER — ONDANSETRON HCL 4 MG/2ML IJ SOLN: 4.0000 mg | Freq: Four times a day (QID) | INTRAMUSCULAR | Status: DC | PRN

## 2015-03-11 MED ORDER — HEPARIN SODIUM (PORCINE) 5000 UNIT/ML IJ SOLN
5000.0000 [IU] | Freq: Three times a day (TID) | INTRAMUSCULAR | Status: DC
  Administered 2015-03-11 – 2015-03-12 (×2): 5000 [IU] via SUBCUTANEOUS
  Filled 2015-03-11 (×2): qty 1

## 2015-03-11 MED ORDER — ASPIRIN 81 MG PO CHEW: 81.0000 mg | CHEWABLE_TABLET | ORAL | Status: DC

## 2015-03-11 MED ORDER — SODIUM CHLORIDE 0.9 % WEIGHT BASED INFUSION: 1.0000 mL/kg/h | INTRAVENOUS | Status: DC

## 2015-03-11 MED ORDER — SODIUM CHLORIDE 0.9 % IJ SOLN: 3.0000 mL | INTRAMUSCULAR | Status: DC | PRN

## 2015-03-11 MED ORDER — ASPIRIN 81 MG PO CHEW
324.0000 mg | CHEWABLE_TABLET | Freq: Once | ORAL | Status: AC
  Administered 2015-03-11: 324 mg via ORAL
  Filled 2015-03-11: qty 4

## 2015-03-11 MED ORDER — NITROGLYCERIN 0.4 MG SL SUBL
0.4000 mg | SUBLINGUAL_TABLET | SUBLINGUAL | Status: DC | PRN
  Administered 2015-03-11 (×2): 0.4 mg via SUBLINGUAL
  Filled 2015-03-11: qty 1

## 2015-03-11 MED ORDER — ACETAMINOPHEN 325 MG PO TABS: 650.0000 mg | ORAL_TABLET | ORAL | Status: DC | PRN

## 2015-03-11 MED ORDER — SODIUM CHLORIDE 0.9 % WEIGHT BASED INFUSION
3.0000 mL/kg/h | INTRAVENOUS | Status: DC
  Administered 2015-03-12: 3 mL/kg/h via INTRAVENOUS

## 2015-03-11 MED ORDER — SODIUM CHLORIDE 0.9 % IJ SOLN: 3.0000 mL | Freq: Two times a day (BID) | INTRAMUSCULAR | Status: DC

## 2015-03-11 MED ORDER — ASPIRIN 81 MG PO CHEW
81.0000 mg | CHEWABLE_TABLET | Freq: Every day | ORAL | Status: DC
  Administered 2015-03-12: 81 mg via ORAL
  Filled 2015-03-11 (×2): qty 1

## 2015-03-11 MED ORDER — NITROGLYCERIN 0.4 MG SL SUBL
0.4000 mg | SUBLINGUAL_TABLET | SUBLINGUAL | Status: DC | PRN
Start: 2015-03-11 — End: 2015-03-11

## 2015-03-11 MED ORDER — METOPROLOL SUCCINATE ER 25 MG PO TB24
25.0000 mg | ORAL_TABLET | Freq: Every day | ORAL | Status: DC
  Administered 2015-03-11 – 2015-03-12 (×2): 25 mg via ORAL
  Filled 2015-03-11 (×2): qty 1

## 2015-03-11 NOTE — ED Notes (Signed)
Pt reports cp since Monday and has gotten worse since then. Took aspirin and it eased off. Now reports central cp, 4/10. Denies sob, has felt nauseated. Pt is a x 4. Denies cardiac hx.
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
## 2015-03-11 NOTE — Telephone Encounter (Signed)
New Message   Pt c/o of Chest Pain: STAT if CP now or developed within 24 hours  1. Are you having CP right now?  YES  2. Are you experiencing any other symptoms (ex. SOB, nausea, vomiting, sweating)?  NO  3. How long have you been experiencing CP?  03/10/15  4. Is your CP continuous or coming and going?  Coming and going  5. Have you taken Nitroglycerin?  No, he has taken 3 Asprin  ?
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
## 2015-03-11 NOTE — Telephone Encounter (Signed)
Patient states he feels a little better and wants to know if he still needs to go to the ED.  Patient st he is still having continued chest discomfort, but not nearly as severe as the last couple days. Encouraged patient to go to ED to get evaluated for continued chest discomfort. Patient agrees with treatment plan.

## 2015-03-11 NOTE — Telephone Encounter (Signed)
Agree. SKAINS, MARK, MD  

## 2015-03-11 NOTE — ED Provider Notes (Signed)
I saw and evaluated the patient, reviewed the resident's note and I agree with the findings and plan.   EKG Interpretation   Date/Time:  Thursday March 11 2015 15:37:51 EDT Ventricular Rate:  72 PR Interval:  164 QRS Duration: 86 QT Interval:  398 QTC Calculation: 435 R Axis:   53 Text Interpretation:  Normal sinus rhythm Cannot rule out Anterior infarct  , age undetermined T wave abnormality, consider lateral ischemia Abnormal  ECG No significant change since last tracing Confirmed by Trace Cederberg  MD,  Deric Bocock ([id number]) on 03/11/2015 5:05:04 PM     Patient here complaining of increased chest pain characterized as substernal. Patient's EKG is unchanged from prior. Consult cardiology  Lorre NickAnthony Jais Demir, MD 03/11/15 1705

## 2015-03-12 ENCOUNTER — Encounter (HOSPITAL_COMMUNITY)
Admission: EM | Disposition: A | Discharge status: Home / Self Care | Payer: 59 | Source: Home / Self Care | Attending: Emergency Medicine

## 2015-03-12 ENCOUNTER — Encounter (HOSPITAL_COMMUNITY): Payer: Self-pay | Admitting: Cardiology

## 2015-03-12 DIAGNOSIS — R079 Chest pain, unspecified: Secondary | ICD-10-CM | POA: Diagnosis not present

## 2015-03-12 HISTORY — PX: CARDIAC CATHETERIZATION: SHX172

## 2015-03-12 LAB — BASIC METABOLIC PANEL
ANION GAP: 8 (ref 5–15)
BUN: 16 mg/dL (ref 6–20)
CO2: 28 mmol/L (ref 22–32)
Calcium: 9.3 mg/dL (ref 8.9–10.3)
Chloride: 103 mmol/L (ref 101–111)
Creatinine, Ser: 1.08 mg/dL (ref 0.61–1.24)
GFR calc Af Amer: >60 mL/min (ref >60)
Glucose, Bld: 156 mg/dL — ABNORMAL HIGH (ref 65–99)
POTASSIUM: 4.1 mmol/L (ref 3.5–5.1)
SODIUM: 139 mmol/L (ref 135–145)

## 2015-03-12 LAB — CBC
HCT: 42.6 % (ref 39.0–52.0)
HEMOGLOBIN: 14.5 g/dL (ref 13.0–17.0)
MCH: 29.2 pg (ref 26.0–34.0)
MCHC: 34 g/dL (ref 30.0–36.0)
MCV: 85.7 fL (ref 78.0–100.0)
Platelets: 237 10*3/uL (ref 150–400)
RBC: 4.97 MIL/uL (ref 4.22–5.81)
RDW: 12.9 % (ref 11.5–15.5)
WBC: 8.8 10*3/uL (ref 4.0–10.5)

## 2015-03-12 LAB — LIPID PANEL
CHOLESTEROL: 209 mg/dL — AB (ref 0–200)
HDL: 33 mg/dL — AB (ref >40)
LDL CALC: 115 mg/dL — AB (ref 0–99)
TRIGLYCERIDES: 305 mg/dL — AB (ref <150)
Total CHOL/HDL Ratio: 6.3 RATIO
VLDL: 61 mg/dL — ABNORMAL HIGH (ref 0–40)

## 2015-03-12 LAB — PROTIME-INR
INR: 1.03 (ref 0.00–1.49)
Prothrombin Time: 13.7 seconds (ref 11.6–15.2)

## 2015-03-12 LAB — TROPONIN I

## 2015-03-12 SURGERY — LEFT HEART CATH AND CORONARY ANGIOGRAPHY
Anesthesia: LOCAL

## 2015-03-12 MED ORDER — FENTANYL CITRATE (PF) 100 MCG/2ML IJ SOLN
INTRAMUSCULAR | Status: DC | PRN
  Administered 2015-03-12: 25 ug via INTRAVENOUS

## 2015-03-12 MED ORDER — SODIUM CHLORIDE 0.9 % IJ SOLN: 3.0000 mL | INTRAMUSCULAR | Status: DC | PRN

## 2015-03-12 MED ORDER — LIDOCAINE HCL (PF) 1 % IJ SOLN
INTRAMUSCULAR | Status: DC | PRN
  Administered 2015-03-12: 10:00:00

## 2015-03-12 MED ORDER — MIDAZOLAM HCL 2 MG/2ML IJ SOLN
INTRAMUSCULAR | Status: DC | PRN
  Administered 2015-03-12: 2 mg via INTRAVENOUS

## 2015-03-12 MED ORDER — IOHEXOL 350 MG/ML SOLN
INTRAVENOUS | Status: DC | PRN
  Administered 2015-03-12: 90 mL via INTRACARDIAC

## 2015-03-12 MED ORDER — HEPARIN SODIUM (PORCINE) 1000 UNIT/ML IJ SOLN
INTRAMUSCULAR | Status: DC | PRN
  Administered 2015-03-12: 5000 [IU] via INTRAVENOUS

## 2015-03-12 MED ORDER — MIDAZOLAM HCL 2 MG/2ML IJ SOLN
INTRAMUSCULAR | Status: AC
  Filled 2015-03-12: qty 2

## 2015-03-12 MED ORDER — HEPARIN (PORCINE) IN NACL 2-0.9 UNIT/ML-% IJ SOLN
INTRAMUSCULAR | Status: AC
  Filled 2015-03-12: qty 1500

## 2015-03-12 MED ORDER — NITROGLYCERIN 1 MG/10 ML FOR IR/CATH LAB
INTRA_ARTERIAL | Status: AC
  Filled 2015-03-12: qty 10

## 2015-03-12 MED ORDER — SODIUM CHLORIDE 0.9 % IV SOLN: 250.0000 mL | INTRAVENOUS | Status: DC | PRN

## 2015-03-12 MED ORDER — LIDOCAINE HCL (PF) 1 % IJ SOLN
INTRAMUSCULAR | Status: AC
  Filled 2015-03-12: qty 30

## 2015-03-12 MED ORDER — SODIUM CHLORIDE 0.9 % WEIGHT BASED INFUSION: 1.0000 mL/kg/h | INTRAVENOUS | Status: AC

## 2015-03-12 MED ORDER — FENTANYL CITRATE (PF) 100 MCG/2ML IJ SOLN
INTRAMUSCULAR | Status: AC
  Filled 2015-03-12: qty 2

## 2015-03-12 MED ORDER — VERAPAMIL HCL 2.5 MG/ML IV SOLN
INTRAVENOUS | Status: DC | PRN
  Administered 2015-03-12: 10:00:00 via INTRA_ARTERIAL

## 2015-03-12 MED ORDER — SODIUM CHLORIDE 0.9 % IJ SOLN: 3.0000 mL | Freq: Two times a day (BID) | INTRAMUSCULAR | Status: DC

## 2015-03-12 SURGICAL SUPPLY — 14 items

## 2015-03-12 NOTE — Interval H&P Note (Signed)
History and Physical Interval Note:  03/12/2015 9:49 AM  Joseph Clark  has presented today for surgery, with the diagnosis of cp  The various methods of treatment have been discussed with the patient and family. After consideration of risks, benefits and other options for treatment, the patient has consented to  Procedure(s): Left Heart Cath and Coronary Angiography (N/A) as a surgical intervention .  The patient's history has been reviewed, patient examined, no change in status, stable for surgery.  I have reviewed the patient's chart and labs.  Questions were answered to the patient's satisfaction.   Cath Lab Visit (complete for each Cath Lab visit)  Clinical Evaluation Leading to the Procedure:   ACS: Yes.    Non-ACS:    Anginal Classification: CCS III  Anti-ischemic medical therapy: Minimal Therapy (1 class of medications)  Non-Invasive Test Results: No non-invasive testing performed  Prior CABG: No previous CABG        Theron Aristaeter [facility] LLCJordanMD,FACC 03/12/2015 9:49 AM

## 2015-03-12 NOTE — Progress Notes (Signed)
Patient Name: Joseph Clark Date of Encounter: 03/12/2015  Primary Cardiologist: Dr. Anne Fu   Principal Problem:   Chest pain with moderate risk for cardiac etiology Active Problems:   Obesity   Hyperlipidemia   GERD (gastroesophageal reflux disease)   HTN (hypertension)    SUBJECTIVE  Denies any CP overnight. Denies ever having SOB at rest. States has been having CP at home and feels winded after climbing stairs.  CURRENT MEDS . aspirin  81 mg Oral Daily  . aspirin EC  81 mg Oral Daily  . heparin  5,000 Units Subcutaneous 3 times per day  . metoprolol succinate  25 mg Oral Daily  . sodium chloride  3 mL Intravenous Q12H    OBJECTIVE  Filed Vitals:   03/11/15 1800 03/11/15 1815 03/11/15 1903 03/12/15 0500  BP: 109/78 129/87 134/85 128/80  Pulse: 73 77 71 67  Temp:   97.8 F (36.6 C) 97.8 F (36.6 C)  TempSrc:   Oral Oral  Resp: Height:   5' 6.5" (1.689 m)   Weight:   219 lb 9.6 oz (99.61 kg)   SpO2: 94% 98% 99% 97%    Intake/Output Summary (Last 24 hours) at 03/12/15 0830 Last data filed at 03/12/15 0700  Gross per 24 hour  Intake 328.68 ml  Output    650 ml  Net -321.32 ml   Filed Weights   03/11/15 1903  Weight: 219 lb 9.6 oz (99.61 kg)    PHYSICAL EXAM  General: Pleasant, NAD. Neuro: Alert and oriented X 3. Moves all extremities spontaneously. Psych: Normal affect. HEENT:  Normal  Neck: Supple without bruits or JVD. Lungs:  Resp regular and unlabored, CTA. Heart: RRR no s3, s4, or murmurs. Abdomen: Soft, non-tender, non-distended, BS + x 4.  Extremities: No clubbing, cyanosis or edema. DP/PT/Radials 2+ and equal bilaterally.  Accessory Clinical Findings  CBC  Recent Labs  03/11/15 1545 03/12/15 0425  WBC 8.1 8.8  HGB 14.8 14.5  HCT 42.9 42.6  MCV 84.4 85.7  PLT 248 237   Basic Metabolic Panel  Recent Labs  03/11/15 1545 03/12/15 0425  NA 140 139  K 4.4 4.1  CL 106 103  CO2 26 28  GLUCOSE 121* 156*  BUN 13  16  CREATININE 0.94 1.08  CALCIUM 9.9 9.3   Cardiac Enzymes  Recent Labs  03/12/15 0425  TROPONINI <0.03   Fasting Lipid Panel  Recent Labs  03/12/15 0425  CHOL 209*  HDL 33*  LDLCALC 115*  TRIG 305*  CHOLHDL 6.3    TELE NSR without significant ventricular ectopy    ECG  NSR with TWI in lateral leads   Radiology/Studies  Dg Chest 2 View  03/11/2015   CLINICAL DATA:  Chest pain starting Monday  EXAM: CHEST  2 VIEW  COMPARISON:  08/30/2008  FINDINGS: The heart size and mediastinal contours are within normal limits. Both lungs are clear. The visualized skeletal structures are unremarkable. Accessory azygos fissure/lobe again noted.  IMPRESSION: No active cardiopulmonary disease.   Electronically Signed   By: Joseph Clark M.D.   On: 03/11/2015 16:05    ASSESSMENT AND PLAN  1. Chest pain  - trop negative.   - no known CAD Normal myoview in 2009 Now with 3 days chest pressure /sob/ doe   - planning for LHC today, risk and benefit of the procedure explained include bleeding, vascular/renal injury, arrhythmia, MI, stroke, loss of life or limb, patient display clear understanding  and agree to proceed.     2. HLD: chol 209, trig 305, HDL 33, LDL 115   Findings of cath will direct Rx    3. GERD  Signed, Joseph Clark, Hao PA-C Pager: [phone] Patient seen and examined  I have amended note to reflect my findings Plan for cath  Today.  Joseph Clark

## 2015-03-12 NOTE — H&P (View-Only) (Signed)
 Patient Name: Joseph Clark Date of Encounter: 03/12/2015  Primary Cardiologist: Dr. Skains   Principal Problem:   Chest pain with moderate risk for cardiac etiology Active Problems:   Obesity   Hyperlipidemia   GERD (gastroesophageal reflux disease)   HTN (hypertension)    SUBJECTIVE  Denies any CP overnight. Denies ever having SOB at rest. States has been having CP at home and feels winded after climbing stairs.  CURRENT MEDS . aspirin  81 mg Oral Daily  . aspirin EC  81 mg Oral Daily  . heparin  5,000 Units Subcutaneous 3 times per day  . metoprolol succinate  25 mg Oral Daily  . sodium chloride  3 mL Intravenous Q12H    OBJECTIVE  Filed Vitals:   03/11/15 1800 03/11/15 1815 03/11/15 1903 03/12/15 0500  BP: 109/78 129/87 134/85 128/80  Pulse: 73 77 71 67  Temp:   97.8 F (36.6 C) 97.8 F (36.6 C)  TempSrc:   Oral Oral  Resp: 13 14 16 18  Height:   5' 6.5" (1.689 m)   Weight:   219 lb 9.6 oz (99.61 kg)   SpO2: 94% 98% 99% 97%    Intake/Output Summary (Last 24 hours) at 03/12/15 0830 Last data filed at 03/12/15 0700  Gross per 24 hour  Intake 328.68 ml  Output    650 ml  Net -321.32 ml   Filed Weights   03/11/15 1903  Weight: 219 lb 9.6 oz (99.61 kg)    PHYSICAL EXAM  General: Pleasant, NAD. Neuro: Alert and oriented X 3. Moves all extremities spontaneously. Psych: Normal affect. HEENT:  Normal  Neck: Supple without bruits or JVD. Lungs:  Resp regular and unlabored, CTA. Heart: RRR no s3, s4, or murmurs. Abdomen: Soft, non-tender, non-distended, BS + x 4.  Extremities: No clubbing, cyanosis or edema. DP/PT/Radials 2+ and equal bilaterally.  Accessory Clinical Findings  CBC  Recent Labs  03/11/15 1545 03/12/15 0425  WBC 8.1 8.8  HGB 14.8 14.5  HCT 42.9 42.6  MCV 84.4 85.7  PLT 248 237   Basic Metabolic Panel  Recent Labs  03/11/15 1545 03/12/15 0425  NA 140 139  K 4.4 4.1  CL 106 103  CO2 26 28  GLUCOSE 121* 156*  BUN 13  16  CREATININE 0.94 1.08  CALCIUM 9.9 9.3   Cardiac Enzymes  Recent Labs  03/12/15 0425  TROPONINI <0.03   Fasting Lipid Panel  Recent Labs  03/12/15 0425  CHOL 209*  HDL 33*  LDLCALC 115*  TRIG 305*  CHOLHDL 6.3    TELE NSR without significant ventricular ectopy    ECG  NSR with TWI in lateral leads   Radiology/Studies  Dg Chest 2 View  03/11/2015   CLINICAL DATA:  Chest pain starting Monday  EXAM: CHEST  2 VIEW  COMPARISON:  08/30/2008  FINDINGS: The heart size and mediastinal contours are within normal limits. Both lungs are clear. The visualized skeletal structures are unremarkable. Accessory azygos fissure/lobe again noted.  IMPRESSION: No active cardiopulmonary disease.   Electronically Signed   By: Liviu  Pop M.D.   On: 03/11/2015 16:05    ASSESSMENT AND PLAN  1. Chest pain  - trop negative.   - no known CAD Normal myoview in 2009 Now with 3 days chest pressure /sob/ doe   - planning for LHC today, risk and benefit of the procedure explained include bleeding, vascular/renal injury, arrhythmia, MI, stroke, loss of life or limb, patient display clear understanding   and agree to proceed.     2. HLD: chol 209, trig 305, HDL 33, LDL 115   Findings of cath will direct Rx    3. GERD  Signed, Amedeo PlentyMeng, Hao PA-C Pager: [phone] Patient seen and examined  I have amended note to reflect my findings Plan for cath  Today.  Dietrich PatesPaula Ross

## 2015-03-12 NOTE — Discharge Instructions (Signed)
No driving for 24 hours. No lifting over 5 lbs for 1 week. No sexual activity for 1 week. Keep procedure site clean & dry. If you notice increased pain, swelling, bleeding or pus, call/return!  You may shower, but no soaking baths/hot tubs/pools for 1 week.  ° ° °

## 2015-03-12 NOTE — Progress Notes (Signed)
Discharge instructions and teaching reviewed with patient. VSS. Pt discharging home with nephew

## 2015-03-12 NOTE — Discharge Summary (Signed)
Discharge Summary   Patient ID: Joseph SnipesVictor S Clark,  MRN: [id number], DOB/AGE: September 03, 1960 54 y.o.  Admit date: 03/11/2015 Discharge date: 03/12/2015  Primary Care Provider: No primary care provider on file. Primary Cardiologist: Dr. Anne FuSkains  Discharge Diagnoses Principal Problem:   Chest pain with moderate risk for cardiac etiology Active Problems:   Obesity   Hyperlipidemia   GERD (gastroesophageal reflux disease)   HTN (hypertension)   Allergies No Known Allergies  Procedures  Cardiac catheterization 03/12/2015  The left ventricular systolic function is normal.  Normal coronary anatomy  Recommendation: stable for DC today. Consider other causes of chest pain as an outpatient.     Hospital Course  Patient is a [age] year old male who has been followed by Dr. Anne FuSkains in the past for chest pain and abnormal EKG. He had a nuclear stress test in March 2015 with which was negative for ischemia. He has past medical history of hyperlipidemia and a family history of coronary artery disease. [facility] with left-sided chest pain radiating to the left shoulder and arm. It has been intermittent for the past 3 days with symptom occurring at rest. He also has been complaining of dyspnea on exertion recently. EKG showed normal sinus rhythm with lateral T-wave inversion unchanged from the previous EKG. Given his symptom and family history, after discussing with the patient various options, it was decided to undergo left heart catheterization to definitively define coronary anatomy.  He was admitted to cardiology service, serial troponin overnight were negative. He underwent cardiac catheterization on 03/12/2015 which showed normal coronary artery without sign of CAD, EF 55-65% by visual estimate. His lipid panel on 7/15 showed cholesterol 209, triglycerides 205, LDL 115, HDL 33. After discussing with M.D., we have encouraged the patient to try weight loss  and exercise and diet. If his lipid panel continued to be high in 3 month, he potentially will need statin therapy. Otherwise he is deemed stable for discharge from cardiology perspective, I have arranged outpatient follow-up with Dr. Anne FuSkains.   Discharge Vitals Blood pressure 132/84, pulse 73, temperature 97.8 F (36.6 C), temperature source Oral, resp. rate 20, height 5' 6.5" (1.689 m), weight 219 lb 9.6 oz (99.61 kg), SpO2 100 %.  Filed Weights   03/11/15 1903  Weight: 219 lb 9.6 oz (99.61 kg)    Labs  CBC  Recent Labs  03/11/15 1545 03/12/15 0425  WBC 8.1 8.8  HGB 14.8 14.5  HCT 42.9 42.6  MCV 84.4 85.7  PLT 248 237   Basic Metabolic Panel  Recent Labs  03/11/15 1545 03/12/15 0425  NA 140 139  K 4.4 4.1  CL 106 103  CO2 26 28  GLUCOSE 121* 156*  BUN 13 16  CREATININE 0.94 1.08  CALCIUM 9.9 9.3   Cardiac Enzymes  Recent Labs  03/12/15 0425  TROPONINI <0.03   Fasting Lipid Panel  Recent Labs  03/12/15 0425  CHOL 209*  HDL 33*  LDLCALC 115*  TRIG 305*  CHOLHDL 6.3    Disposition  Pt is being discharged home today in good condition.  Follow-up Plans & Appointments      Follow-up Information    Follow up with Donato SchultzSKAINS, MARK, MD On 05/14/2015.   Specialty:  Cardiology   Why:  2:15pm.   Contact information:   [address] [place] [address] [phone]       Discharge Medications    Medication List    TAKE these medications  aspirin 81 MG tablet  Take 81 mg by mouth at bedtime.     CENTRUM SILVER PO  Take 1 tablet by mouth daily.     cholecalciferol 1000 UNITS tablet  Commonly known as:  VITAMIN D  Take 1,000 Units by mouth daily.     fexofenadine 180 MG tablet  Commonly known as:  ALLEGRA  Take 180 mg by mouth at bedtime.     Fish Oil 1000 MG Caps  Take 1 capsule by mouth 3 (three) times daily with meals.     Glucosamine HCl 1500 MG Tabs  Take 1 tablet by mouth daily.     Glucosamine-MSM  500-500 MG Tabs  Take 1 tablet by mouth daily.     nitroGLYCERIN 0.4 MG SL tablet  Commonly known as:  NITROSTAT  Place 1 tablet (0.4 mg total) under the tongue every 5 (five) minutes as needed for chest pain.        Duration of Discharge Encounter   Greater than 30 minutes including physician time.  Ramond Dial PA-C Pager: [phone] 03/12/2015, 2:17 PM   Agree with d/c plans as noted by Harrell Lark  F/u of lipids as outpt.    Dietrich Pates

## 2015-03-15 MED FILL — Heparin Sodium (Porcine) 2 Unit/ML in Sodium Chloride 0.9%: INTRAMUSCULAR | Qty: 500 | Status: AC

## 2015-05-14 ENCOUNTER — Encounter: Payer: Self-pay | Admitting: Cardiology

## 2015-05-14 ENCOUNTER — Ambulatory Visit (INDEPENDENT_AMBULATORY_CARE_PROVIDER_SITE_OTHER): Payer: 59 | Admitting: Cardiology

## 2015-05-14 VITALS — BP 140/88 | HR 68 | Ht 67.0 in | Wt 219.5 lb

## 2015-05-14 DIAGNOSIS — E669 Obesity, unspecified: Secondary | ICD-10-CM

## 2015-05-14 DIAGNOSIS — R0789 Other chest pain: Secondary | ICD-10-CM | POA: Diagnosis not present

## 2015-05-14 DIAGNOSIS — R9431 Abnormal electrocardiogram [ECG] [EKG]: Secondary | ICD-10-CM

## 2015-05-14 NOTE — Patient Instructions (Signed)
Medication Instructions:  Your physician recommends that you continue on your current medications as directed. Please refer to the Current Medication list given to you today.  Labwork: NONE  Testing/Procedures: NONE  Follow-Up: Your physician recommends that you schedule a follow-up appointment as needed.    
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
## 2018-01-09 ENCOUNTER — Ambulatory Visit
Admission: RE | Admit: 2018-01-09 | Discharge: 2018-01-09 | Disposition: A | Discharge status: Home / Self Care | Payer: 59 | Source: Ambulatory Visit | Attending: Internal Medicine | Admitting: Internal Medicine

## 2018-01-09 ENCOUNTER — Other Ambulatory Visit: Payer: Self-pay | Admitting: Internal Medicine

## 2018-01-09 DIAGNOSIS — R0789 Other chest pain: Secondary | ICD-10-CM

## 2018-07-11 ENCOUNTER — Other Ambulatory Visit: Payer: Self-pay | Admitting: Neurosurgery

## 2018-07-11 DIAGNOSIS — M5414 Radiculopathy, thoracic region: Secondary | ICD-10-CM

## 2018-07-11 DIAGNOSIS — M5412 Radiculopathy, cervical region: Secondary | ICD-10-CM

## 2018-07-28 ENCOUNTER — Ambulatory Visit
Admission: RE | Admit: 2018-07-28 | Discharge: 2018-07-28 | Disposition: A | Discharge status: Home / Self Care | Payer: 59 | Source: Ambulatory Visit | Attending: Neurosurgery | Admitting: Neurosurgery

## 2018-07-28 DIAGNOSIS — M5412 Radiculopathy, cervical region: Secondary | ICD-10-CM

## 2018-07-28 DIAGNOSIS — M5414 Radiculopathy, thoracic region: Secondary | ICD-10-CM

## 2018-11-08 ENCOUNTER — Other Ambulatory Visit: Payer: Self-pay | Admitting: Internal Medicine

## 2018-11-08 DIAGNOSIS — E1169 Type 2 diabetes mellitus with other specified complication: Secondary | ICD-10-CM

## 2018-11-15 ENCOUNTER — Other Ambulatory Visit: Payer: 59

## 2018-11-21 ENCOUNTER — Other Ambulatory Visit: Payer: 59

## 2019-02-06 ENCOUNTER — Ambulatory Visit
Admission: RE | Admit: 2019-02-06 | Discharge: 2019-02-06 | Disposition: A | Discharge status: Home / Self Care | Payer: 59 | Source: Ambulatory Visit | Attending: Internal Medicine | Admitting: Internal Medicine

## 2019-02-06 DIAGNOSIS — E1169 Type 2 diabetes mellitus with other specified complication: Secondary | ICD-10-CM

## 2019-06-30 ENCOUNTER — Other Ambulatory Visit: Payer: Self-pay | Admitting: Internal Medicine

## 2019-06-30 ENCOUNTER — Ambulatory Visit
Admission: RE | Admit: 2019-06-30 | Discharge: 2019-06-30 | Disposition: A | Discharge status: Home / Self Care | Payer: 59 | Source: Ambulatory Visit | Attending: Internal Medicine | Admitting: Internal Medicine

## 2019-06-30 DIAGNOSIS — M25512 Pain in left shoulder: Secondary | ICD-10-CM

## 2019-10-29 ENCOUNTER — Other Ambulatory Visit: Payer: Self-pay | Admitting: Internal Medicine

## 2019-10-29 ENCOUNTER — Ambulatory Visit
Admission: RE | Admit: 2019-10-29 | Discharge: 2019-10-29 | Disposition: A | Discharge status: Home / Self Care | Payer: 59 | Source: Ambulatory Visit | Attending: Internal Medicine | Admitting: Internal Medicine

## 2019-10-29 DIAGNOSIS — M549 Dorsalgia, unspecified: Secondary | ICD-10-CM

## 2020-02-10 ENCOUNTER — Encounter (INDEPENDENT_AMBULATORY_CARE_PROVIDER_SITE_OTHER): Payer: Self-pay | Admitting: Otolaryngology

## 2020-02-10 ENCOUNTER — Other Ambulatory Visit: Payer: Self-pay

## 2020-02-10 ENCOUNTER — Ambulatory Visit (INDEPENDENT_AMBULATORY_CARE_PROVIDER_SITE_OTHER): Payer: 59 | Admitting: Otolaryngology

## 2020-02-10 VITALS — Temp 97.9°F

## 2020-02-10 DIAGNOSIS — J310 Chronic rhinitis: Secondary | ICD-10-CM
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
## 2020-05-14 ENCOUNTER — Encounter (INDEPENDENT_AMBULATORY_CARE_PROVIDER_SITE_OTHER): Payer: Self-pay | Admitting: Otolaryngology

## 2020-05-14 ENCOUNTER — Other Ambulatory Visit: Payer: Self-pay

## 2020-05-14 ENCOUNTER — Ambulatory Visit (INDEPENDENT_AMBULATORY_CARE_PROVIDER_SITE_OTHER): Payer: 59 | Admitting: Otolaryngology

## 2020-05-14 VITALS — Temp 97.5°F

## 2020-05-14 DIAGNOSIS — H6982 Other specified disorders of Eustachian tube, left ear: Secondary | ICD-10-CM

## 2020-05-14 DIAGNOSIS — J0191 Acute recurrent sinusitis, unspecified: Secondary | ICD-10-CM

## 2020-05-14 DIAGNOSIS — H6123 Impacted cerumen, bilateral: Secondary | ICD-10-CM | POA: Diagnosis not present
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
## 2021-06-27 ENCOUNTER — Other Ambulatory Visit: Payer: Self-pay | Admitting: Neurosurgery

## 2021-06-27 DIAGNOSIS — M5412 Radiculopathy, cervical region: Secondary | ICD-10-CM

## 2021-07-26 ENCOUNTER — Ambulatory Visit
Admission: RE | Admit: 2021-07-26 | Discharge: 2021-07-26 | Disposition: A | Discharge status: Home / Self Care | Payer: 59 | Source: Ambulatory Visit | Attending: Neurosurgery | Admitting: Neurosurgery

## 2021-07-26 ENCOUNTER — Other Ambulatory Visit: Payer: Self-pay

## 2021-07-26 DIAGNOSIS — M5412 Radiculopathy, cervical region: Secondary | ICD-10-CM

## 2021-11-17 ENCOUNTER — Ambulatory Visit: Payer: 59 | Admitting: Podiatry

## 2021-11-17 ENCOUNTER — Other Ambulatory Visit: Payer: Self-pay

## 2021-11-17 ENCOUNTER — Ambulatory Visit (INDEPENDENT_AMBULATORY_CARE_PROVIDER_SITE_OTHER): Payer: 59

## 2021-11-17 DIAGNOSIS — M722 Plantar fascial fibromatosis: Secondary | ICD-10-CM | POA: Diagnosis not present

## 2021-11-17 MED ORDER — MELOXICAM 15 MG PO TABS: 15.0000 mg | ORAL_TABLET | Freq: Every day | ORAL | 3 refills | Status: AC

## 2021-11-17 MED ORDER — METHYLPREDNISOLONE 4 MG PO TBPK: ORAL_TABLET | ORAL | 0 refills | Status: DC

## 2021-11-17 MED ORDER — TRIAMCINOLONE ACETONIDE 40 MG/ML IJ SUSP
20.0000 mg | Freq: Once | INTRAMUSCULAR | Status: AC
  Administered 2021-11-17: 20 mg
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
## 2021-12-20 ENCOUNTER — Ambulatory Visit: Payer: 59 | Admitting: Podiatry

## 2021-12-20 ENCOUNTER — Telehealth: Payer: Self-pay | Admitting: Podiatry

## 2021-12-20 NOTE — Telephone Encounter (Signed)
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 He asked to be rescheduled for 5.3.2023 or 5.5.2023 after about 1030.  ? ?I returned call and left message for pt to call to discuss appt as Dr Al Corpus is out of the office on the dates requested. ?

## 2022-01-26 ENCOUNTER — Ambulatory Visit: Payer: 59 | Admitting: Podiatry

## 2022-01-26 ENCOUNTER — Encounter: Payer: Self-pay | Admitting: Podiatry

## 2022-01-26 DIAGNOSIS — M722 Plantar fascial fibromatosis: Secondary | ICD-10-CM

## 2022-01-26 MED ORDER — TRIAMCINOLONE ACETONIDE 40 MG/ML IJ SUSP
20.0000 mg | Freq: Once | INTRAMUSCULAR | Status: AC
  Administered 2022-01-26: 20 mg

## 2022-01-28 NOTE — Progress Notes (Signed)
He presents today for follow-up of his plantar fascia out of his right heel.  States that is considerably better approximately 80% improved does not hurt every day.  Objective: Vital signs are stable he is alert and oriented x3 he has pain on palpation medial calcaneal tubercle of the right heel.  Assessment: Mild plantar fasciitis.  Plan: I reinjected the right heel today with Kenalog we will follow-up with him in 1 month if necessary.

## 2022-03-14 ENCOUNTER — Encounter: Payer: Self-pay | Admitting: Podiatry

## 2022-03-14 ENCOUNTER — Ambulatory Visit: Payer: 59 | Admitting: Podiatry

## 2022-03-14 DIAGNOSIS — M722 Plantar fascial fibromatosis: Secondary | ICD-10-CM

## 2022-03-14 MED ORDER — TRIAMCINOLONE ACETONIDE 40 MG/ML IJ SUSP
20.0000 mg | Freq: Once | INTRAMUSCULAR | Status: AC
  Administered 2022-03-14: 20 mg

## 2022-03-14 NOTE — Progress Notes (Signed)
He presents today for follow-up of his right heel plan fasciitis states that he is doing much better currently he states that he is about 85% improved.  Objective: Vital signs are stable he is alert and oriented x3.  Pulses are palpable.  There is no erythema edema salines drainage odor he has pain on palpation medial calcaneal tubercle of the right heel.  Assessment: Resolving Planter fasciitis 85% right.  Plan: Reinjected the right heel today 20 mg Kenalog 5 mg Marcaine point maximal tenderness.  May need to see about getting him orthotics I would like to follow-up with him in about a month to 6 weeks.

## 2022-04-25 ENCOUNTER — Ambulatory Visit: Payer: 59 | Admitting: Podiatry

## 2022-04-25 ENCOUNTER — Encounter: Payer: Self-pay | Admitting: Podiatry

## 2022-04-25 DIAGNOSIS — M722 Plantar fascial fibromatosis: Secondary | ICD-10-CM | POA: Diagnosis not present

## 2022-04-25 MED ORDER — TRIAMCINOLONE ACETONIDE 40 MG/ML IJ SUSP
20.0000 mg | Freq: Once | INTRAMUSCULAR | Status: AC
  Administered 2022-04-25: 20 mg

## 2022-04-26 NOTE — Progress Notes (Signed)
He presents today chief complaint of painful Planter fasciitis states that is much better than it was previously.  States that is greater than 85%.  Objective: Vital signs are stable alert and oriented x3.  Pulses are palpable.  There is no erythema edema cellulitis drainage noted has pain on palpation medial calcaneal tubercle.  Assessment: Chronic intractable Planter fasciitis.  Plan: Reinjected today 10 mg Kenalog 5 mg Marcaine point maximal tenderness.  Also scheduled him for orthotic casting.

## 2022-05-10 ENCOUNTER — Other Ambulatory Visit: Payer: 59

## 2022-09-12 NOTE — Progress Notes (Signed)
Patient presents today to be casted for custom molded orthotics. HYATT is the treating physician.  Impression foam cast was taken. ABN signed.  Patient info-  Shoe size: 9.5 D  Shoe style: ATHLETIC  Height: 5'6  Weight: 198  Insurance: [place]   Patient will be notified once orthotics arrive in office and reappoint for fitting at that time.

## 2022-09-13 ENCOUNTER — Ambulatory Visit (INDEPENDENT_AMBULATORY_CARE_PROVIDER_SITE_OTHER): Payer: 59 | Admitting: Podiatry

## 2022-09-13 DIAGNOSIS — M722 Plantar fascial fibromatosis: Secondary | ICD-10-CM | POA: Diagnosis not present

## 2022-10-17 ENCOUNTER — Telehealth: Payer: Self-pay | Admitting: Podiatry

## 2022-10-17 NOTE — Telephone Encounter (Signed)
Lmom to call back to schedule picking  up orthotics    Balance -pending charges

## 2022-12-20 ENCOUNTER — Telehealth: Payer: Self-pay | Admitting: Podiatry

## 2022-12-20 NOTE — Telephone Encounter (Signed)
Spoke with Burna Mortimer at [facility] regarding pt orthotics, patient does not have DME coverage on his policy  She said that we can submit letter of medical necessity with claim number and they will review.  Gave information for claim to Chip Boer to resubmit.

## 2022-12-26 ENCOUNTER — Ambulatory Visit (INDEPENDENT_AMBULATORY_CARE_PROVIDER_SITE_OTHER): Payer: 59

## 2022-12-26 DIAGNOSIS — M722 Plantar fascial fibromatosis: Secondary | ICD-10-CM

## 2022-12-26 NOTE — Progress Notes (Signed)
Patient presents today to pick up custom molded foot orthotics recommended by Dr. HYATT.   Orthotics were dispensed and fit was satisfactory. Reviewed instructions for break-in and wear. Written instructions given to patient.  Patient will follow up as needed.     

## 2023-01-03 ENCOUNTER — Other Ambulatory Visit: Payer: Self-pay

## 2023-01-04 ENCOUNTER — Other Ambulatory Visit: Payer: Self-pay

## 2023-01-04 MED ORDER — TRULICITY 4.5 MG/0.5ML ~~LOC~~ SOAJ
4.5000 mg | SUBCUTANEOUS | 0 refills | Status: DC
  Filled 2023-01-04 – 2023-01-11 (×2): qty 2, 28d supply, fill #0

## 2023-01-10 ENCOUNTER — Other Ambulatory Visit: Payer: Self-pay

## 2023-01-11 ENCOUNTER — Other Ambulatory Visit: Payer: Self-pay

## 2023-02-06 ENCOUNTER — Other Ambulatory Visit: Payer: Self-pay

## 2023-02-06 MED ORDER — TRULICITY 4.5 MG/0.5ML ~~LOC~~ SOAJ
4.5000 mg | SUBCUTANEOUS | 0 refills | Status: AC
  Filled 2023-02-06: qty 2, 28d supply, fill #0

## 2023-02-07 ENCOUNTER — Other Ambulatory Visit: Payer: Self-pay

## 2023-02-08 ENCOUNTER — Other Ambulatory Visit: Payer: Self-pay

## 2023-02-13 ENCOUNTER — Other Ambulatory Visit: Payer: Self-pay

## 2023-04-03 ENCOUNTER — Other Ambulatory Visit: Payer: Self-pay | Admitting: Otolaryngology

## 2023-04-03 DIAGNOSIS — R42 Dizziness and giddiness: Secondary | ICD-10-CM

## 2023-04-24 ENCOUNTER — Encounter: Payer: Self-pay | Admitting: Cardiology

## 2023-04-24 ENCOUNTER — Ambulatory Visit: Payer: 59 | Attending: Cardiology | Admitting: Cardiology

## 2023-04-24 VITALS — BP 132/80 | HR 77 | Ht 67.0 in | Wt 228.0 lb

## 2023-04-24 DIAGNOSIS — R072 Precordial pain: Secondary | ICD-10-CM | POA: Diagnosis not present

## 2023-04-24 DIAGNOSIS — E785 Hyperlipidemia, unspecified: Secondary | ICD-10-CM

## 2023-04-24 DIAGNOSIS — Z01812 Encounter for preprocedural laboratory examination: Secondary | ICD-10-CM | POA: Diagnosis not present

## 2023-04-24 MED ORDER — METOPROLOL TARTRATE 50 MG PO TABS: 50.0000 mg | ORAL_TABLET | ORAL | 0 refills | Status: AC

## 2023-04-24 MED ORDER — ROSUVASTATIN CALCIUM 20 MG PO TABS: 20.0000 mg | ORAL_TABLET | Freq: Every day | ORAL | 3 refills | Status: AC
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
## 2023-04-24 NOTE — Patient Instructions (Signed)
Medication Instructions:  Please discontinue simvastatin and start Crestor 20 mg once a day. Continue all other medications as listed.  *If you need a refill on your cardiac medications before your next appointment, please call your pharmacy*   Lab Work: Please have blood work today (BMP) If you have labs (blood work) drawn today and your tests are completely normal, you will receive your results only by: MyChart Message (if you have MyChart) OR A paper copy in the mail If you have any lab test that is abnormal or we need to change your treatment, we will call you to review the results.   Testing/Procedures: Your physician has requested that you have an echocardiogram. Echocardiography is a painless test that uses sound waves to create images of your heart. It provides your doctor with information about the size and shape of your heart and how well your heart's chambers and valves are working. This procedure takes approximately one hour. There are no restrictions for this procedure. Please do NOT wear cologne, perfume, aftershave, or lotions (deodorant is allowed). Please arrive 15 minutes prior to your appointment time.    Your cardiac CT will be scheduled at:   [facility] [address] [place], [place] [address] [phone]  Please arrive at the [facility] and Children's Entrance (Entrance C2) of [facility] 30 minutes prior to test start time. You can use the FREE valet parking offered at entrance C (encouraged to control the heart rate for the test)  Proceed to the [facility] Radiology Department (first floor) to check-in and test prep.  All radiology patients and guests should use entrance C2 at [facility], accessed from [facility], even though the hospital's physical address listed is [address].     Please follow these instructions carefully (unless otherwise directed):  An IV will be required for this test and  Nitroglycerin will be given.  Hold all erectile dysfunction medications at least 3 days (72 hrs) prior to test. (Ie viagra, cialis, sildenafil, tadalafil, etc)   On the Night Before the Test: Be sure to Drink plenty of water. Do not consume any caffeinated/decaffeinated beverages or chocolate 12 hours prior to your test. Do not take any antihistamines 12 hours prior to your test.  On the Day of the Test: Drink plenty of water until 1 hour prior to the test. Do not eat any food 1 hour prior to test. You may take your regular medications prior to the test.  Take metoprolol (Lopressor) two hours prior to test. If you take Furosemide/Hydrochlorothiazide/Spironolactone, please HOLD on the morning of the test.  After the Test: Drink plenty of water. After receiving IV contrast, you may experience a mild flushed feeling. This is normal. On occasion, you may experience a mild rash up to 24 hours after the test. This is not dangerous. If this occurs, you can take Benadryl 25 mg and increase your fluid intake. If you experience trouble breathing, this can be serious. If it is severe call 911 IMMEDIATELY. If it is mild, please call our office. If you take any of these medications: Glipizide/Metformin, Avandament, Glucavance, please do not take 48 hours after completing test unless otherwise instructed.  We will call to schedule your test 2-4 weeks out understanding that some insurance companies will need an authorization prior to the service being performed.   For more information and frequently asked questions, please visit our website : [email or web address]  For non-scheduling related questions, please contact the  cardiac imaging nurse navigator should you have any questions/concerns: Cardiac Imaging Nurse Navigators Direct Office Dial: [phone]   For scheduling needs, including cancellations and rescheduling, please call [place], [phone].   Follow-Up: At [facility], you and your health needs are our priority.  As part of our continuing mission to provide you with exceptional heart care, we have created designated Provider Care Teams.  These Care Teams include your primary Cardiologist (physician) and Advanced Practice Providers (APPs -  Physician Assistants and Nurse Practitioners) who all work together to provide you with the care you need, when you need it.  We recommend signing up for the patient portal called "MyChart".  Sign up information is provided on this After Visit Summary.  MyChart is used to connect with patients for Virtual Visits (Telemedicine).  Patients are able to view lab/test results, encounter notes, upcoming appointments, etc.  Non-urgent messages can be sent to your provider as well.   To learn more about what you can do with MyChart, go to [email or web address].    Your next appointment:   Follow up will be based on the results of the above testing.

## 2023-04-25 LAB — BASIC METABOLIC PANEL
BUN/Creatinine Ratio: 17 (ref 10–24)
BUN: 16 mg/dL (ref 8–27)
CO2: 23 mmol/L (ref 20–29)
Calcium: 9.8 mg/dL (ref 8.6–10.2)
Chloride: 101 mmol/L (ref 96–106)
Creatinine, Ser: 0.95 mg/dL (ref 0.76–1.27)
Glucose: 173 mg/dL — ABNORMAL HIGH (ref 70–99)
Potassium: 4.3 mmol/L (ref 3.5–5.2)
Sodium: 135 mmol/L (ref 134–144)
eGFR: 92 mL/min/{1.73_m2} (ref >59)

## 2023-05-03 ENCOUNTER — Encounter (HOSPITAL_COMMUNITY): Payer: Self-pay

## 2023-05-04 ENCOUNTER — Telehealth (HOSPITAL_COMMUNITY): Payer: Self-pay | Admitting: *Deleted

## 2023-05-04 NOTE — Telephone Encounter (Signed)
Attempted to call patient regarding upcoming cardiac CT appointment. °Left message on voicemail with name and callback number ° °Merle Prescott RN Navigator Cardiac Imaging °[facility] °[phone] Office °[phone] Cell ° °

## 2023-05-04 NOTE — Telephone Encounter (Signed)
Patient returning call upcoming cardiac imaging study; pt verbalizes understanding of appt date/time, parking situation and where to check in, pre-test NPO status and medications ordered, and verified current allergies; name and call back number provided for further questions should they arise  Larey Brick RN Navigator Cardiac Imaging [facility] [phone] office [phone] cell  Patient to take 50mg  metoprolol tartrate two hours prior to his cardiac CT scan. He is aware to arrive at 11 AM.

## 2023-05-07 ENCOUNTER — Telehealth (HOSPITAL_COMMUNITY): Payer: Self-pay | Admitting: Emergency Medicine

## 2023-05-07 ENCOUNTER — Ambulatory Visit (HOSPITAL_COMMUNITY)
Admission: RE | Admit: 2023-05-07 | Discharge: 2023-05-07 | Disposition: A | Discharge status: Home / Self Care | Payer: 59 | Source: Ambulatory Visit | Attending: Cardiology | Admitting: Cardiology

## 2023-05-07 DIAGNOSIS — R072 Precordial pain: Secondary | ICD-10-CM | POA: Insufficient documentation

## 2023-05-07 MED ORDER — NITROGLYCERIN 0.4 MG SL SUBL
0.8000 mg | SUBLINGUAL_TABLET | Freq: Once | SUBLINGUAL | Status: AC
  Administered 2023-05-07: 0.8 mg via SUBLINGUAL

## 2023-05-07 MED ORDER — IOHEXOL 350 MG/ML SOLN
95.0000 mL | Freq: Once | INTRAVENOUS | Status: AC | PRN
  Administered 2023-05-07: 95 mL via INTRAVENOUS

## 2023-05-07 MED ORDER — NITROGLYCERIN 0.4 MG SL SUBL
SUBLINGUAL_TABLET | SUBLINGUAL | Status: AC
  Filled 2023-05-07: qty 2

## 2023-05-07 NOTE — Telephone Encounter (Signed)
Calling patient to r/s due to [facility] scanner being out of service. Left message to call back. Rockwell Alexandria RN Navigator Cardiac Imaging [facility] [phone] Office  [phone] Cell

## 2023-05-12 ENCOUNTER — Ambulatory Visit
Admission: RE | Admit: 2023-05-12 | Discharge: 2023-05-12 | Disposition: A | Discharge status: Home / Self Care | Payer: 59 | Source: Ambulatory Visit | Attending: Otolaryngology | Admitting: Otolaryngology

## 2023-05-12 DIAGNOSIS — R42 Dizziness and giddiness: Secondary | ICD-10-CM

## 2023-05-12 MED ORDER — GADOPICLENOL 0.5 MMOL/ML IV SOLN
10.0000 mL | Freq: Once | INTRAVENOUS | Status: AC | PRN
  Administered 2023-05-12: 10 mL via INTRAVENOUS

## 2023-05-14 ENCOUNTER — Ambulatory Visit (HOSPITAL_COMMUNITY): Payer: 59 | Attending: Cardiology

## 2023-05-14 DIAGNOSIS — R072 Precordial pain: Secondary | ICD-10-CM | POA: Insufficient documentation

## 2023-05-14 LAB — ECHOCARDIOGRAM COMPLETE
Area-P 1/2: 5.02 cm2
S' Lateral: 2.7 cm

## 2023-06-06 IMAGING — MR MR CERVICAL SPINE W/O CM
4 of 5 series · 29 of 48 positions shown · non-contrast
Comparison: 07/28/2018

CLINICAL DATA: Neck pain radiating into the right shoulder and arm
with arm weakness. Symptoms for 1 year.

EXAM:
MRI CERVICAL SPINE WITHOUT CONTRAST
TECHNIQUE: Multiplanar, multisequence MR imaging of the cervical spine was
performed. No intravenous contrast was administered.

[Series 2: T2 · sagittal · 3.0mm · 0.66mm/px · 7 of 15 slices shown (1 of 2)]
[im 1/15]
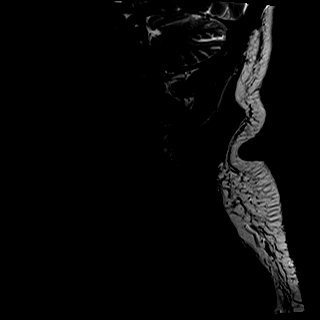
[im 3/15]
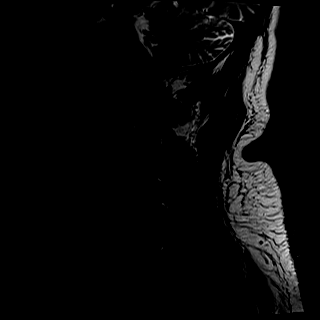
[im 5/15]
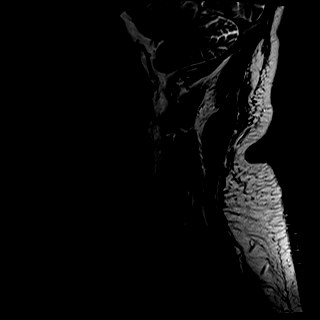
[im 8/15]
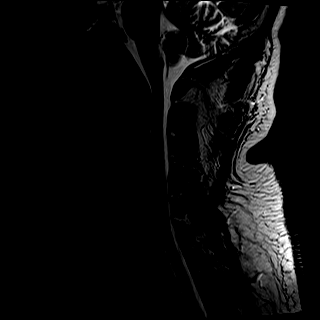
[im 10/15]
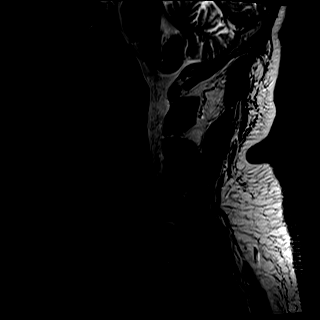
[im 12/15]
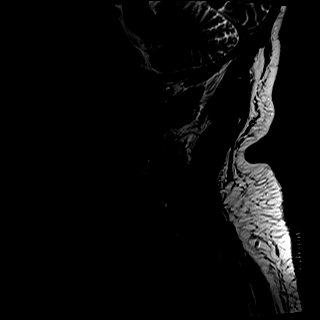
[im 15/15]
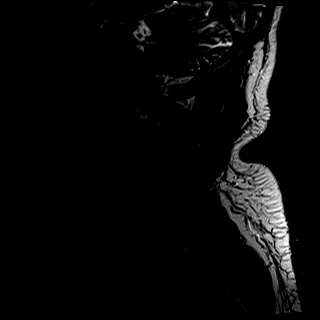

[Series 3: T1 · sagittal · 3.0mm · 0.41mm/px · 7 of 15 slices shown]
[im 1/15]
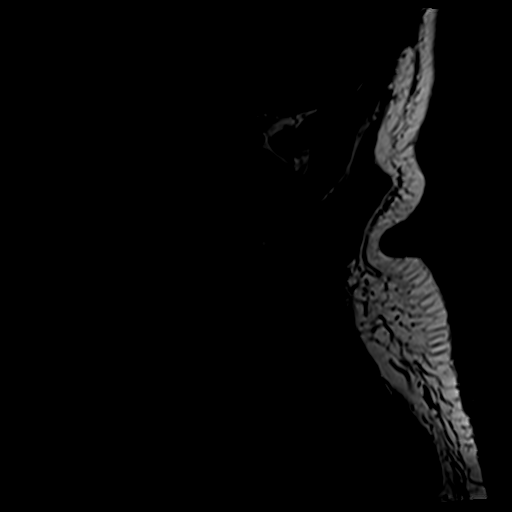
[im 3/15]
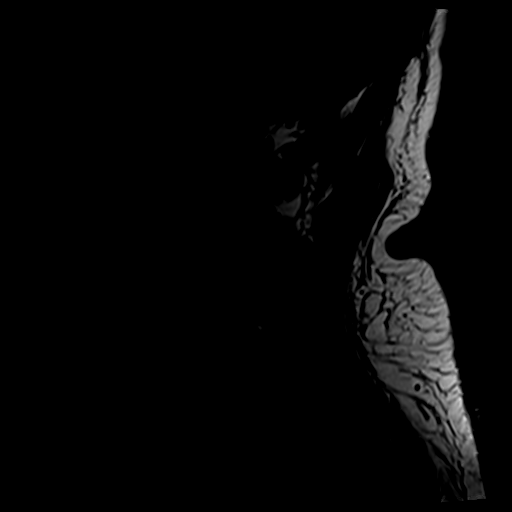
[im 5/15]
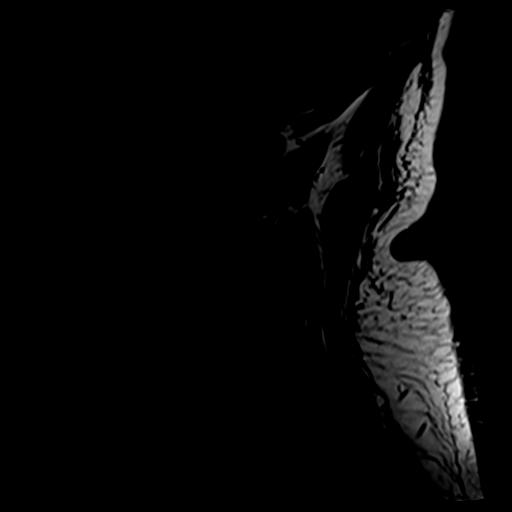
[im 8/15]
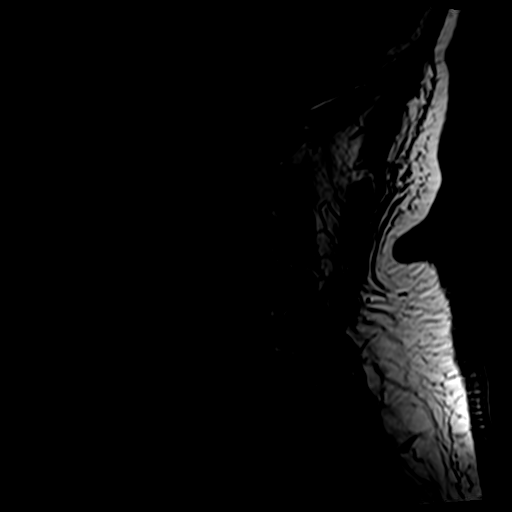
[im 10/15]
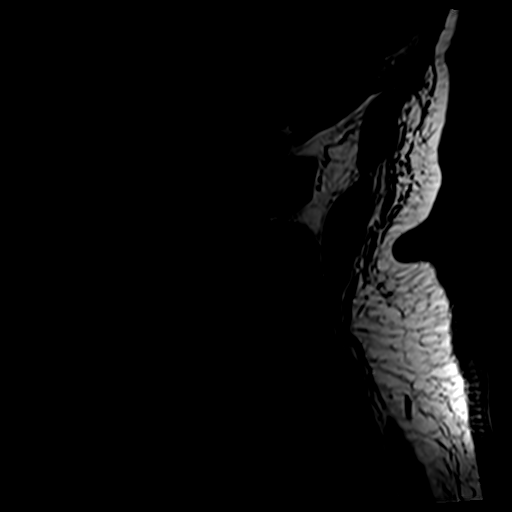
[im 12/15]
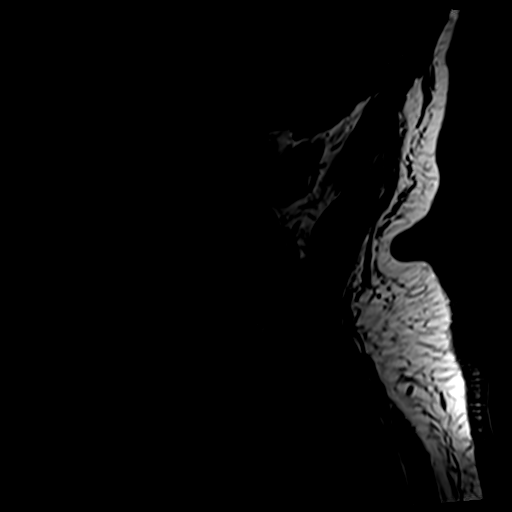
[im 15/15]
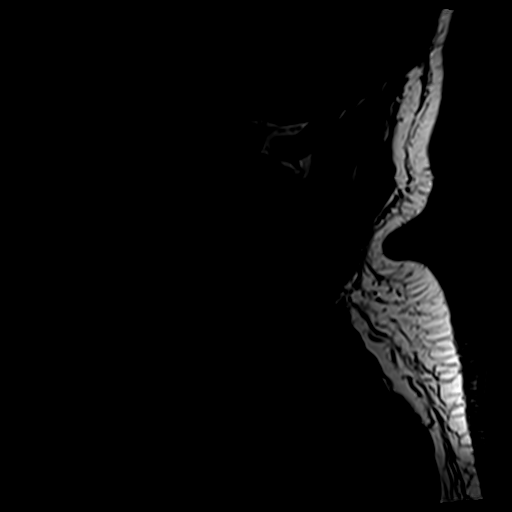

[Series 4: tir sag · sagittal · 3.0mm · 0.41mm/px · 6 of 15 slices shown]
[im 1/15]
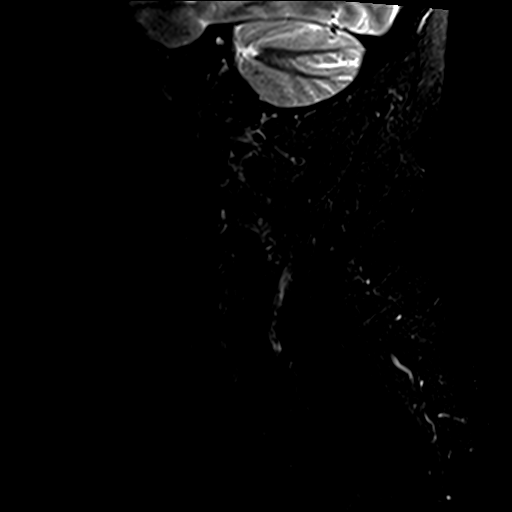
[im 3/15]
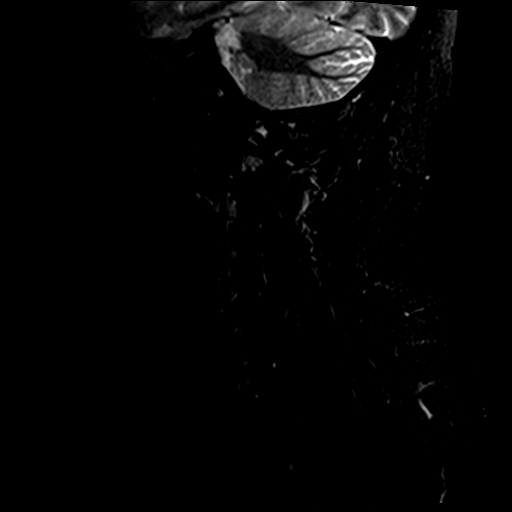
[im 5/15]
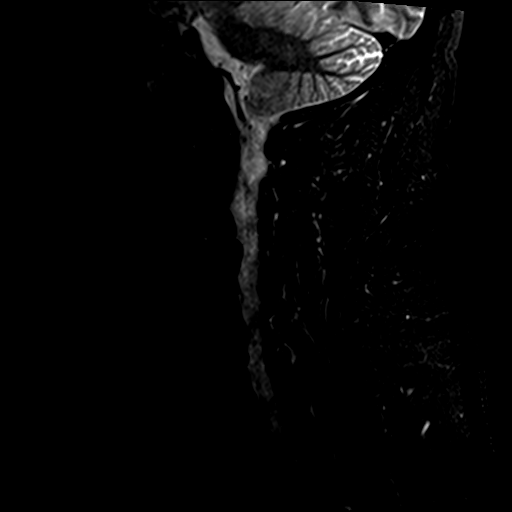
[im 7/15]
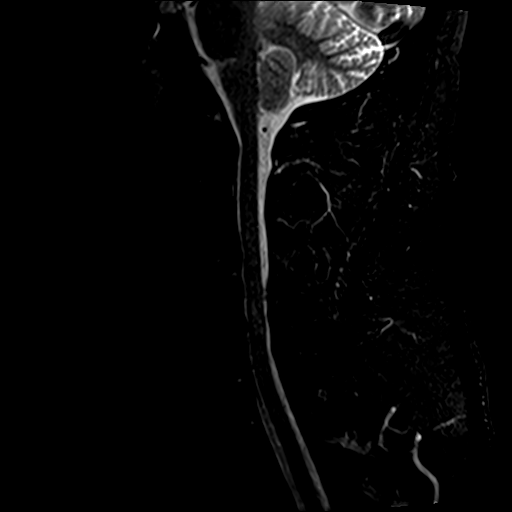
[im 9/15]
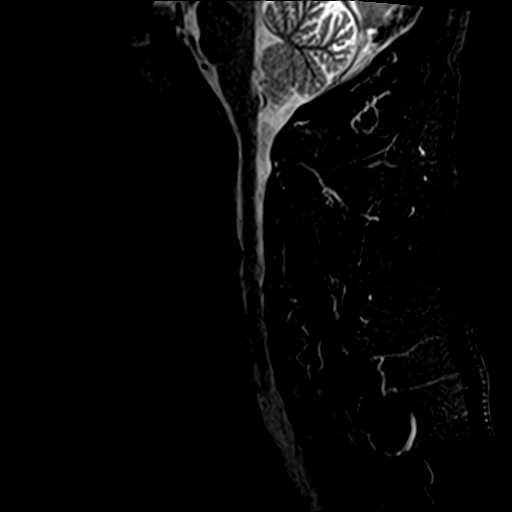
[im 13/15]
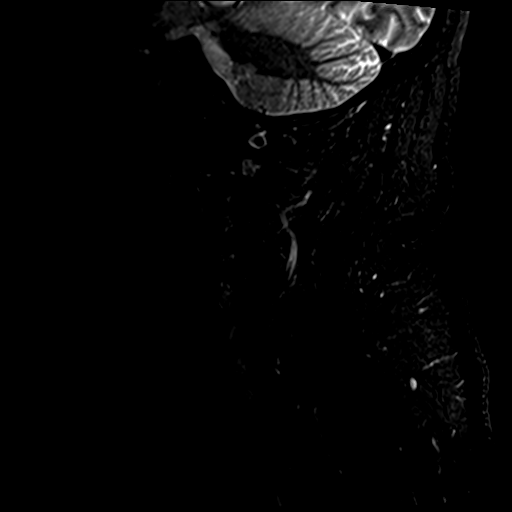

[Series 6: T2 · axial · 3.0mm · 0.70mm/px · z∈[-95,-6]mm · 9 of 25 slices shown (2 of 2)]
[im 1/25]
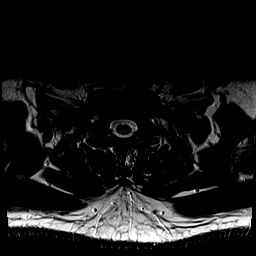
[im 5/25]
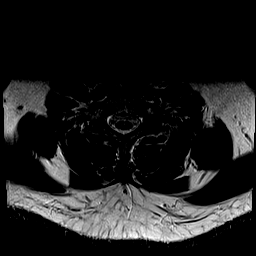
[im 9/25]
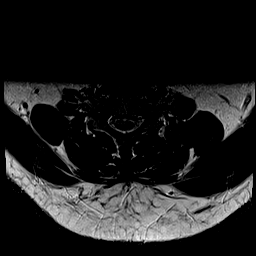
[im 11/25]
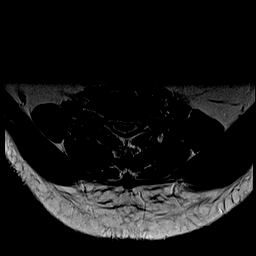
[im 13/25]
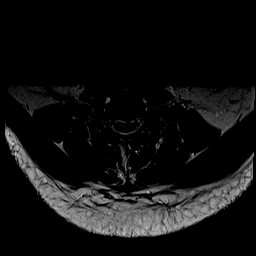
[im 15/25]
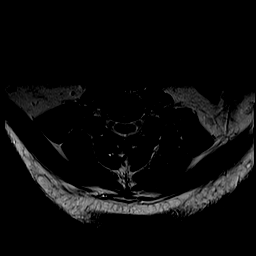
[im 17/25]
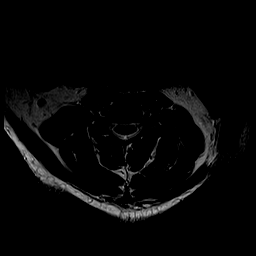
[im 21/25]
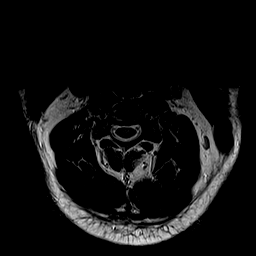
[im 25/25]
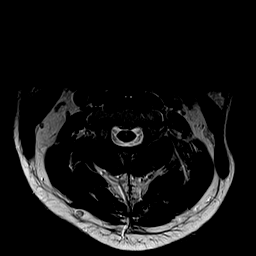

[29 of 48 positions shown; findings below may reference images not displayed]

FINDINGS: Alignment: Physiologic.

Vertebrae: No fracture, evidence of discitis, or bone lesion.

Cord: Normal signal and morphology.

Posterior Fossa, vertebral arteries, paraspinal tissues: Negative.

Disc levels:

C2-3: Unremarkable.

C3-4: Mild disc narrowing with left more than right uncovertebral
ridging. Moderate left foraminal stenosis. Mild right foraminal
narrowing

C4-5: Spondylosis and tiny central protrusion

C5-6: Disc narrowing and bulging with uncovertebral and endplate
ridging eccentric to the left. Moderate left foraminal impingement.
Mild-to-moderate right foraminal narrowing.

C6-7: Disc narrowing and bulging with uncovertebral ridging on the
left more than right. Advanced left foraminal impingement. Moderate
right foraminal narrowing

C7-T1:Mild facet spurring asymmetric to the left. Mild left
foraminal stenosis.
IMPRESSION: 1. Cervical spine degeneration from C3-4 to C6-7.
2. Diffusely patent spinal canal.
3. On the symptomatic right side there is foraminal stenosis at C5-6
and C6-7.
4. Even greater left-sided foraminal impingement seen at C3-4, C5-6,
and C6-7.

## 2023-08-09 ENCOUNTER — Other Ambulatory Visit: Payer: Self-pay | Admitting: Gastroenterology

## 2023-08-09 DIAGNOSIS — R1032 Left lower quadrant pain: Secondary | ICD-10-CM

## 2023-08-24 ENCOUNTER — Ambulatory Visit
Admission: RE | Admit: 2023-08-24 | Discharge: 2023-08-24 | Disposition: A | Discharge status: Home / Self Care | Payer: 59 | Source: Ambulatory Visit | Attending: Gastroenterology | Admitting: Gastroenterology

## 2023-08-24 DIAGNOSIS — R1032 Left lower quadrant pain: Secondary | ICD-10-CM

## 2023-08-24 MED ORDER — IOPAMIDOL (ISOVUE-300) INJECTION 61%
100.0000 mL | Freq: Once | INTRAVENOUS | Status: AC | PRN
  Administered 2023-08-24: 100 mL via INTRAVENOUS

## 2023-12-18 ENCOUNTER — Other Ambulatory Visit: Payer: Self-pay | Admitting: Internal Medicine

## 2023-12-18 DIAGNOSIS — M791 Myalgia, unspecified site: Secondary | ICD-10-CM

## 2023-12-27 ENCOUNTER — Ambulatory Visit
Admission: RE | Admit: 2023-12-27 | Discharge: 2023-12-27 | Disposition: A | Discharge status: Home / Self Care | Source: Ambulatory Visit | Attending: Internal Medicine | Admitting: Internal Medicine

## 2023-12-27 DIAGNOSIS — M791 Myalgia, unspecified site: Secondary | ICD-10-CM

## 2024-01-02 ENCOUNTER — Other Ambulatory Visit: Payer: Self-pay | Admitting: Internal Medicine

## 2024-01-02 DIAGNOSIS — G8929 Other chronic pain: Secondary | ICD-10-CM

## 2024-01-02 DIAGNOSIS — I739 Peripheral vascular disease, unspecified: Secondary | ICD-10-CM

## 2024-01-02 DIAGNOSIS — M5442 Lumbago with sciatica, left side: Secondary | ICD-10-CM

## 2024-01-10 ENCOUNTER — Encounter: Payer: Self-pay | Admitting: Internal Medicine

## 2024-01-22 ENCOUNTER — Ambulatory Visit
Admission: RE | Admit: 2024-01-22 | Discharge: 2024-01-22 | Disposition: A | Discharge status: Home / Self Care | Source: Ambulatory Visit | Attending: Internal Medicine | Admitting: Internal Medicine

## 2024-01-22 DIAGNOSIS — G8929 Other chronic pain: Secondary | ICD-10-CM

## 2024-01-22 DIAGNOSIS — M5442 Lumbago with sciatica, left side: Secondary | ICD-10-CM

## 2024-01-22 DIAGNOSIS — I739 Peripheral vascular disease, unspecified: Secondary | ICD-10-CM

## 2024-09-16 ENCOUNTER — Other Ambulatory Visit: Payer: Self-pay | Admitting: Surgery

## 2024-09-16 ENCOUNTER — Ambulatory Visit
Admission: RE | Admit: 2024-09-16 | Discharge: 2024-09-16 | Disposition: A | Source: Ambulatory Visit | Attending: Surgery | Admitting: Surgery

## 2024-09-16 DIAGNOSIS — M5412 Radiculopathy, cervical region: Secondary | ICD-10-CM
